# Patient Record
Sex: Female | Born: 1965 | Race: White | Hispanic: No | Marital: Married | Smoking: Never smoker
Health system: Southern US, Community
[De-identification: ages and names within clinical notes are randomized; demographics above are authoritative.]

## PROBLEM LIST (undated history)

## (undated) DIAGNOSIS — D239 Other benign neoplasm of skin, unspecified: Secondary | ICD-10-CM

---
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
## 1998-06-25 ENCOUNTER — Other Ambulatory Visit: Admission: RE | Admit: 1998-06-25 | Discharge: 1998-06-25 | Payer: Self-pay | Admitting: Obstetrics and Gynecology

## 1999-07-12 ENCOUNTER — Other Ambulatory Visit: Admission: RE | Admit: 1999-07-12 | Discharge: 1999-07-12 | Payer: Self-pay | Admitting: Obstetrics and Gynecology

## 2000-07-18 ENCOUNTER — Other Ambulatory Visit: Admission: RE | Admit: 2000-07-18 | Discharge: 2000-07-18 | Payer: Self-pay | Admitting: Obstetrics and Gynecology

## 2000-07-24 ENCOUNTER — Encounter: Payer: Self-pay | Admitting: Obstetrics and Gynecology

## 2000-07-24 ENCOUNTER — Encounter: Admission: RE | Admit: 2000-07-24 | Discharge: 2000-07-24 | Payer: Self-pay | Admitting: Obstetrics and Gynecology

## 2001-07-30 ENCOUNTER — Other Ambulatory Visit: Admission: RE | Admit: 2001-07-30 | Discharge: 2001-07-30 | Payer: Self-pay | Admitting: Obstetrics and Gynecology

## 2002-11-05 ENCOUNTER — Other Ambulatory Visit: Admission: RE | Admit: 2002-11-05 | Discharge: 2002-11-05 | Payer: Self-pay | Admitting: Obstetrics and Gynecology

## 2004-05-04 ENCOUNTER — Ambulatory Visit: Payer: Self-pay

## 2009-10-01 ENCOUNTER — Encounter: Admission: RE | Admit: 2009-10-01 | Discharge: 2009-10-01 | Payer: Self-pay | Admitting: Obstetrics and Gynecology

## 2010-03-15 ENCOUNTER — Emergency Department: Payer: Self-pay | Admitting: Emergency Medicine

## 2010-09-22 ENCOUNTER — Other Ambulatory Visit: Payer: Self-pay | Admitting: Obstetrics and Gynecology

## 2010-09-22 DIAGNOSIS — Z1231 Encounter for screening mammogram for malignant neoplasm of breast: Secondary | ICD-10-CM

## 2010-10-05 ENCOUNTER — Ambulatory Visit
Admission: RE | Admit: 2010-10-05 | Discharge: 2010-10-05 | Disposition: A | Discharge status: Home / Self Care | Payer: BC Managed Care – PPO | Source: Ambulatory Visit | Attending: Obstetrics and Gynecology | Admitting: Obstetrics and Gynecology

## 2010-10-05 DIAGNOSIS — Z1231 Encounter for screening mammogram for malignant neoplasm of breast: Secondary | ICD-10-CM

## 2010-10-06 ENCOUNTER — Other Ambulatory Visit: Payer: Self-pay | Admitting: Obstetrics and Gynecology

## 2010-10-06 DIAGNOSIS — R928 Other abnormal and inconclusive findings on diagnostic imaging of breast: Secondary | ICD-10-CM

## 2010-10-14 ENCOUNTER — Ambulatory Visit
Admission: RE | Admit: 2010-10-14 | Discharge: 2010-10-14 | Disposition: A | Discharge status: Home / Self Care | Payer: BC Managed Care – PPO | Source: Ambulatory Visit | Attending: Obstetrics and Gynecology | Admitting: Obstetrics and Gynecology

## 2010-10-14 DIAGNOSIS — R928 Other abnormal and inconclusive findings on diagnostic imaging of breast: Secondary | ICD-10-CM

## 2011-09-22 ENCOUNTER — Other Ambulatory Visit: Payer: Self-pay | Admitting: Obstetrics & Gynecology

## 2011-09-22 DIAGNOSIS — Z1231 Encounter for screening mammogram for malignant neoplasm of breast: Secondary | ICD-10-CM

## 2011-10-06 ENCOUNTER — Ambulatory Visit
Admission: RE | Admit: 2011-10-06 | Discharge: 2011-10-06 | Disposition: A | Discharge status: Home / Self Care | Payer: BC Managed Care – PPO | Source: Ambulatory Visit | Attending: Obstetrics & Gynecology | Admitting: Obstetrics & Gynecology

## 2011-10-06 DIAGNOSIS — Z1231 Encounter for screening mammogram for malignant neoplasm of breast: Secondary | ICD-10-CM

## 2012-09-13 ENCOUNTER — Other Ambulatory Visit: Payer: Self-pay

## 2012-09-13 DIAGNOSIS — Z1231 Encounter for screening mammogram for malignant neoplasm of breast: Secondary | ICD-10-CM

## 2012-10-09 ENCOUNTER — Ambulatory Visit
Admission: RE | Admit: 2012-10-09 | Discharge: 2012-10-09 | Disposition: A | Discharge status: Home / Self Care | Payer: BC Managed Care – PPO | Source: Ambulatory Visit

## 2012-10-09 DIAGNOSIS — Z1231 Encounter for screening mammogram for malignant neoplasm of breast: Secondary | ICD-10-CM

## 2013-03-13 ENCOUNTER — Ambulatory Visit: Payer: Self-pay | Admitting: Gastroenterology

## 2013-10-01 ENCOUNTER — Other Ambulatory Visit: Payer: Self-pay

## 2013-10-01 DIAGNOSIS — Z1231 Encounter for screening mammogram for malignant neoplasm of breast: Secondary | ICD-10-CM

## 2013-11-11 ENCOUNTER — Ambulatory Visit
Admission: RE | Admit: 2013-11-11 | Discharge: 2013-11-11 | Disposition: A | Discharge status: Home / Self Care | Payer: BC Managed Care – PPO | Source: Ambulatory Visit

## 2013-11-11 DIAGNOSIS — Z1231 Encounter for screening mammogram for malignant neoplasm of breast: Secondary | ICD-10-CM

## 2014-07-28 ENCOUNTER — Ambulatory Visit
Admit: 2014-07-28 | Disposition: A | Discharge status: Home / Self Care | Payer: Self-pay | Attending: Gastroenterology | Admitting: Gastroenterology

## 2014-12-22 ENCOUNTER — Encounter: Payer: Self-pay | Admitting: Podiatry

## 2014-12-22 ENCOUNTER — Ambulatory Visit (INDEPENDENT_AMBULATORY_CARE_PROVIDER_SITE_OTHER): Payer: BC Managed Care – PPO | Admitting: Podiatry

## 2014-12-22 ENCOUNTER — Ambulatory Visit (INDEPENDENT_AMBULATORY_CARE_PROVIDER_SITE_OTHER): Payer: BC Managed Care – PPO

## 2014-12-22 VITALS — BP 157/96 | HR 75 | Resp 16

## 2014-12-22 DIAGNOSIS — M2010 Hallux valgus (acquired), unspecified foot: Secondary | ICD-10-CM

## 2014-12-22 NOTE — Patient Instructions (Signed)

## 2014-12-22 NOTE — Progress Notes (Signed)
   Subjective:    Patient ID: Heidi Watson, female    DOB: December 04, 1965, 49 y.o.   MRN: [id number]  HPI: She presents today with a 5 year duration of painful bunions bilaterally. She states that the right one seems to be worse than the left one. She states this is been going on for quite some time and it really only bothers her if she wears tight shoes. She notices that the joints and started to become more painful with range of motion and fears that she is developing arthritis. She denies any trauma to the feet. 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    Review of Systems  Musculoskeletal: Positive for arthralgias and gait problem.  All other systems reviewed and are negative.      Objective:   Physical Exam: 49 year old white female in no acute distress vital signs stable alert and oriented 3. Pulses are strongly palpable bilateral. Neurologic sensorium is intact per Semmes-Weinstein monofilament. Deep tendon reflexes are intact bilateral muscle strength +5 over 5 dorsiflexion plantar flexors and inverters and everters all just musculature is intact. Orthopedic evaluation demonstrates all joints distal to the ankle for range of motion without crepitation. She has an increase in the first intermetatarsal angle developing hallux abductovalgus deformity. She has limitation on range of motion with a valgus deformity and rotation of the hallux. Cutaneous evaluation demonstrates supple well-hydrated cutis no erythema edema cellulitis drainage or odor.        Assessment & Plan:  Hallux abductovalgus deformity bilateral.  Plan: We discussed the etiology pathology conservative versus surgical therapies. At this point she would like to consider surgery in late spring or early summer next year. At this point we discussed in great detail today [facility]. She will be leaving for [place] as soon as she has finished with her school work in June and will return mid-June for  surgery. I will follow-up with her in May for a consult. I did recommend that we could perform both of these at the same time.

## 2015-01-29 ENCOUNTER — Other Ambulatory Visit: Payer: Self-pay | Admitting: Orthopedic Surgery

## 2015-01-29 DIAGNOSIS — M1711 Unilateral primary osteoarthritis, right knee: Secondary | ICD-10-CM

## 2015-01-29 DIAGNOSIS — M25861 Other specified joint disorders, right knee: Secondary | ICD-10-CM

## 2015-02-09 ENCOUNTER — Ambulatory Visit
Admission: RE | Admit: 2015-02-09 | Discharge: 2015-02-09 | Disposition: A | Discharge status: Home / Self Care | Payer: BC Managed Care – PPO | Source: Ambulatory Visit | Attending: Orthopedic Surgery | Admitting: Orthopedic Surgery

## 2015-02-09 DIAGNOSIS — M7051 Other bursitis of knee, right knee: Secondary | ICD-10-CM | POA: Diagnosis not present

## 2015-02-09 DIAGNOSIS — M25561 Pain in right knee: Secondary | ICD-10-CM | POA: Diagnosis present

## 2015-02-09 DIAGNOSIS — S83241A Other tear of medial meniscus, current injury, right knee, initial encounter: Secondary | ICD-10-CM | POA: Insufficient documentation

## 2015-02-09 DIAGNOSIS — M659 Synovitis and tenosynovitis, unspecified: Secondary | ICD-10-CM | POA: Diagnosis not present

## 2015-02-09 DIAGNOSIS — M25861 Other specified joint disorders, right knee: Secondary | ICD-10-CM

## 2015-02-09 DIAGNOSIS — M1711 Unilateral primary osteoarthritis, right knee: Secondary | ICD-10-CM

## 2015-02-09 DIAGNOSIS — M25461 Effusion, right knee: Secondary | ICD-10-CM | POA: Insufficient documentation

## 2015-08-10 ENCOUNTER — Encounter: Payer: Self-pay | Admitting: Podiatry

## 2015-08-10 ENCOUNTER — Ambulatory Visit (INDEPENDENT_AMBULATORY_CARE_PROVIDER_SITE_OTHER): Payer: BC Managed Care – PPO | Admitting: Podiatry

## 2015-08-10 VITALS — BP 141/85 | HR 72 | Resp 12

## 2015-08-10 DIAGNOSIS — M2010 Hallux valgus (acquired), unspecified foot: Secondary | ICD-10-CM

## 2015-08-10 NOTE — Progress Notes (Signed)
She presents today for follow-up and a surgical consult. She would like to have her bunions corrected in June. She states they have gotten worse and the pain is much more symptomatic. She ears still painful and she looks forward to having these corrected.  Objective: Vital signs are stable she is alert and oriented 3 have reviewed her past mental history medications allergies surgeries and social history. The only change is that she is starting a new hypertension medication. Pulses are strongly palpable neurologic sensorium is intact no open lesions or wounds. Orthopedic evaluation demonstrates moderate hallux abductovalgus deformity bilateral. Painful in range of motion of the first metatarsophalangeal joint and on palpation of the medial hypertrophic condyle. Radiographs were once again reviewed with patient.  Assessment: Hallux abductovalgus deformity bilateral right greater than left.  Plan: Discussed etiology pathology conservative versus surgical therapies. We consented her today for an [facility] bunion repair with screw fixation first metatarsophalangeal joint bilateral. I answered all the questions regarding this procedure to the best mobility in layman's terms. We discussed possible postop complications which may include but are not limited to postop pain bleeding swelling infection recurrence need for further surgery over correction under correction also sensation loss of digit loss of limb loss of life. She signed all through patient the consent form and she was given the appropriate paperwork for the surgery center. We will follow up with her in the near future for surgical intervention. We dispensed to [facility] today for her surgical recovery.

## 2015-08-10 NOTE — Patient Instructions (Signed)

## 2015-08-12 ENCOUNTER — Telehealth: Payer: Self-pay | Admitting: Podiatry

## 2015-08-12 ENCOUNTER — Telehealth: Payer: Self-pay | Admitting: *Deleted

## 2015-08-12 NOTE — Telephone Encounter (Signed)
Ok

## 2015-08-12 NOTE — Telephone Encounter (Signed)
I attempted to call patient in regards to scheduling surgery.  I apologized for not calling her.  I did not receive a message from her regarding scheduling surgery.  I don't know where the missed communication may have gone.  Please accept my apology.  Give me a call back.  "You just left me a message.  I didn't try to call you.  I left a message for someone by the name of Janett Billow.  Then I tried again to speak to someone today and no one ever called me back.  I just decided not to have the surgery."  I'm so sorry.  Janett Billow is our nurse.  She was short staffed yesterday and she's in our [facility] office today so she may not have had the chance to check her messages.  I apologize.  I'll let Dr. Milinda Pointer know.  You take care.  Call if you need [place].

## 2015-08-12 NOTE — Telephone Encounter (Signed)
Patient just called and said that she had called [place] yesterday and left a messages or you to call her back and she never got a call back yesterday. She called today here in [place] and wanted to speak with Laqueta Carina was in a room with a patient so I took a message to return her call. About 15 minutes after that, the patient stormed into our office, threw the air walker boots and the surgery packet back on the front desk and said that she was not going to have the surgery, nor did she need a call back and she was returning our items so she will not be billed for them and walked out.  Dr. Milinda Pointer said that he wanted you to call her back today asap.

## 2015-08-26 ENCOUNTER — Other Ambulatory Visit: Payer: Self-pay | Admitting: Student

## 2015-08-26 DIAGNOSIS — K7689 Other specified diseases of liver: Secondary | ICD-10-CM

## 2015-09-01 ENCOUNTER — Ambulatory Visit
Admission: RE | Admit: 2015-09-01 | Discharge: 2015-09-01 | Disposition: A | Discharge status: Home / Self Care | Payer: BC Managed Care – PPO | Source: Ambulatory Visit | Attending: Student | Admitting: Student

## 2015-09-01 DIAGNOSIS — K7689 Other specified diseases of liver: Secondary | ICD-10-CM | POA: Diagnosis present

## 2015-10-16 DIAGNOSIS — I10 Essential (primary) hypertension: Secondary | ICD-10-CM | POA: Insufficient documentation

## 2016-05-24 DIAGNOSIS — M25531 Pain in right wrist: Secondary | ICD-10-CM | POA: Insufficient documentation

## 2016-05-24 DIAGNOSIS — S52501A Unspecified fracture of the lower end of right radius, initial encounter for closed fracture: Secondary | ICD-10-CM | POA: Insufficient documentation

## 2016-08-03 DIAGNOSIS — S52591D Other fractures of lower end of right radius, subsequent encounter for closed fracture with routine healing: Secondary | ICD-10-CM | POA: Insufficient documentation

## 2016-09-08 ENCOUNTER — Other Ambulatory Visit: Payer: Self-pay | Admitting: Student

## 2016-09-08 DIAGNOSIS — K7689 Other specified diseases of liver: Secondary | ICD-10-CM

## 2016-10-04 ENCOUNTER — Ambulatory Visit
Admission: RE | Admit: 2016-10-04 | Discharge: 2016-10-04 | Disposition: A | Discharge status: Home / Self Care | Payer: BC Managed Care – PPO | Source: Ambulatory Visit | Attending: Student | Admitting: Student

## 2016-10-04 DIAGNOSIS — K7689 Other specified diseases of liver: Secondary | ICD-10-CM | POA: Diagnosis present

## 2016-10-10 DIAGNOSIS — D239 Other benign neoplasm of skin, unspecified: Secondary | ICD-10-CM

## 2016-10-10 HISTORY — DX: Other benign neoplasm of skin, unspecified: D23.9

## 2016-10-11 DIAGNOSIS — C439 Malignant melanoma of skin, unspecified: Secondary | ICD-10-CM

## 2016-10-11 HISTORY — DX: Malignant melanoma of skin, unspecified: C43.9

## 2017-01-18 DIAGNOSIS — M25561 Pain in right knee: Secondary | ICD-10-CM | POA: Insufficient documentation

## 2017-01-18 DIAGNOSIS — M25562 Pain in left knee: Secondary | ICD-10-CM | POA: Insufficient documentation

## 2017-04-26 ENCOUNTER — Other Ambulatory Visit: Payer: Self-pay | Admitting: Podiatry

## 2017-04-26 ENCOUNTER — Ambulatory Visit (INDEPENDENT_AMBULATORY_CARE_PROVIDER_SITE_OTHER): Payer: BC Managed Care – PPO | Admitting: Podiatry

## 2017-04-26 ENCOUNTER — Ambulatory Visit (INDEPENDENT_AMBULATORY_CARE_PROVIDER_SITE_OTHER): Payer: BC Managed Care – PPO

## 2017-04-26 ENCOUNTER — Encounter: Payer: Self-pay | Admitting: Podiatry

## 2017-04-26 DIAGNOSIS — M2012 Hallux valgus (acquired), left foot: Secondary | ICD-10-CM

## 2017-04-26 DIAGNOSIS — M2011 Hallux valgus (acquired), right foot: Secondary | ICD-10-CM

## 2017-04-26 DIAGNOSIS — M79673 Pain in unspecified foot: Secondary | ICD-10-CM

## 2017-04-26 NOTE — Patient Instructions (Signed)
Pre-Operative Instructions  Congratulations, you have decided to take an important step towards improving your quality of life.  You can be assured that the doctors and staff at [facility] will be with you every step of the way.  Here are some important things you should know:  1. Plan to be at the surgery center/hospital at least 1 (one) hour prior to your scheduled time, unless otherwise directed by the surgical center/hospital staff.  You must have a responsible adult accompany you, remain during the surgery and drive you home.  Make sure you have directions to the surgical center/hospital to ensure you arrive on time. 2. If you are having surgery at [facility] or [facility], you will need a copy of your medical history and physical form from your family physician within one month prior to the date of surgery. We will give you a form for your primary physician to complete.  3. We make every effort to accommodate the date you request for surgery.  However, there are times where surgery dates or times have to be moved.  We will contact you as soon as possible if a change in schedule is required.   4. No aspirin/ibuprofen for one week before surgery.  If you are on aspirin, any non-steroidal anti-inflammatory medications (Mobic, Aleve, Ibuprofen) should not be taken seven (7) days prior to your surgery.  You make take Tylenol for pain prior to surgery.  5. Medications - If you are taking daily heart and blood pressure medications, seizure, reflux, allergy, asthma, anxiety, pain or diabetes medications, make sure you notify the surgery center/hospital before the day of surgery so they can tell you which medications you should take or avoid the day of surgery. 6. No food or drink after midnight the night before surgery unless directed otherwise by surgical center/hospital staff. 7. No alcoholic beverages 24-hours prior to surgery.  No smoking 24-hours prior or 24-hours after  surgery. 8. Wear loose pants or shorts. They should be loose enough to fit over bandages, boots, and casts. 9. Don't wear slip-on shoes. Sneakers are preferred. 10. Bring your boot with you to the surgery center/hospital.  Also bring crutches or a walker if your physician has prescribed it for you.  If you do not have this equipment, it will be provided for you after surgery. 11. If you have not been contacted by the surgery center/hospital by the day before your surgery, call to confirm the date and time of your surgery. 12. Leave-time from work may vary depending on the type of surgery you have.  Appropriate arrangements should be made prior to surgery with your employer. 13. Prescriptions will be provided immediately following surgery by your doctor.  Fill these as soon as possible after surgery and take the medication as directed. Pain medications will not be refilled on weekends and must be approved by the doctor. 14. Remove nail polish on the operative foot and avoid getting pedicures prior to surgery. 15. Wash the night before surgery.  The night before surgery wash the foot and leg well with water and the antibacterial soap provided. Be sure to pay special attention to beneath the toenails and in between the toes.  Wash for at least three (3) minutes. Rinse thoroughly with water and dry well with a towel.  Perform this wash unless told not to do so by your physician.  Enclosed: 1 Ice pack (please put in freezer the night before surgery)   1 Hibiclens skin cleaner     Pre-op instructions  If you have any questions regarding the instructions, please do not hesitate to call our office.  [place]: [address], [place], [place] [address] -- [phone]  [place]: [address] [place], [place] [address] -- [phone]  [place]: [address]  [place], [place] [address] -- [phone]  [place]: [address], [place], [place] [address] -- [phone]  Website:  [email or web address] 

## 2017-04-26 NOTE — Progress Notes (Signed)
She presents today stating that her bunion deformities have finally progressed to the point where she feels that she needs to have them corrected.  She states that is starting to affect her ability to perform her daily activities it is interfering with her daily life.  She states that shoe gear is particularly bothersome.  She states that they occur every single day in the right and seems to be worse than the left one.  Objective: Vital signs are stable alert and oriented x3.  Pulses are palpable.  I have reviewed her past medical history medications allergies surgery social history and review of systems.  There are no changes.  She has moderate to severe hallux valgus deformity and radiographs confirm that once again today.  An increase in the first intermetatarsal angle greater than normal value with a hallux abductus angle greater than normal value indicating early dislocation of the first metatarsophalangeal joint.  Hypertrophic medial condyle is also noted first metatarsal.  Assessment: Hallux abductovalgus deformities bilateral right greater than left.  Plan: We discussed the etiology pathology conservative versus surgical therapies today.  At this point we consented her for an [facility] bunion repair with screw fixation to the right foot.  Answered all the questions regarding this procedure to the best my ability in layman's terms.  He understood this was amenable to it and signed all 3 pages of the consent form.  We did discuss the possible postop complications which may consist include but are not limited to postop pain bleeding swelling infection recurrence need further surgery overcorrection under correction and possible removal of internal fixation.  We dispensed a Cam walker today for postop recovery she was also given both oral and written home-going instructions to assist her in paperwork for the transfer especially surgical center and anesthesia group.  Follow-up with her in July for surgery.  She  is to notify us with any health changes between now and that time.

## 2017-09-26 DIAGNOSIS — D039 Melanoma in situ, unspecified: Secondary | ICD-10-CM

## 2017-09-26 HISTORY — DX: Melanoma in situ, unspecified: D03.9

## 2017-10-02 ENCOUNTER — Other Ambulatory Visit: Payer: Self-pay | Admitting: Student

## 2017-10-02 DIAGNOSIS — K7689 Other specified diseases of liver: Secondary | ICD-10-CM

## 2017-11-01 ENCOUNTER — Ambulatory Visit
Admission: RE | Admit: 2017-11-01 | Discharge: 2017-11-01 | Disposition: A | Discharge status: Home / Self Care | Payer: BC Managed Care – PPO | Source: Ambulatory Visit | Attending: Student | Admitting: Student

## 2017-11-01 ENCOUNTER — Other Ambulatory Visit: Payer: Self-pay | Admitting: Podiatry

## 2017-11-01 DIAGNOSIS — K7689 Other specified diseases of liver: Secondary | ICD-10-CM | POA: Insufficient documentation

## 2017-11-01 MED ORDER — OXYCODONE-ACETAMINOPHEN 10-325 MG PO TABS: 1.0000 | ORAL_TABLET | Freq: Three times a day (TID) | ORAL | 0 refills | Status: AC | PRN

## 2017-11-01 MED ORDER — CLINDAMYCIN HCL 150 MG PO CAPS: 150.0000 mg | ORAL_CAPSULE | Freq: Three times a day (TID) | ORAL | 0 refills | Status: DC

## 2017-11-01 MED ORDER — ONDANSETRON HCL 4 MG PO TABS: 4.0000 mg | ORAL_TABLET | Freq: Three times a day (TID) | ORAL | 0 refills | Status: DC | PRN

## 2017-11-03 ENCOUNTER — Encounter: Payer: Self-pay | Admitting: Podiatry

## 2017-11-03 DIAGNOSIS — M2011 Hallux valgus (acquired), right foot: Secondary | ICD-10-CM

## 2017-11-06 ENCOUNTER — Telehealth: Payer: Self-pay | Admitting: *Deleted

## 2017-11-06 NOTE — Telephone Encounter (Signed)
POST OP CALL-    1) General condition stated by the patient: GREAT  2) Is the pt having pain? SOME, NUMB TIL SUNDAY  3) Pain score:   4) Has the pt taken Rx'd pain medication, regularly or PRN? PRN, MOSTLY IBUPROFEN  5) Is the pain medication giving relief? YES  6) Any fever, chills, nausea, or vomiting, shortness of breath or tightness in calf? NAUSEA WHEN INITIALLY TOOK PAIN MEDS  7) Is the bandage clean, dry and intact? YES  8) Is there excessive tightness, bleeding or drainage coming through the bandage? NO  9) Did you understand all of the post op instruction sheet given? NO  10) Any questions or concerns regarding post op care/recovery? ASKING HOW LONG TO BE UP ON IT (ADVISED NO MORE THAN 5-10 MINUTES/HOUR) AND QUESTIONS REGARDING PRESSING TOE DOWN WHEN SHE WAS UP ON IT (ADVISED TO TRY TO WALK AS NORMAL AS POSSIBLE WITH BOOT ON AND TO TRY NOT TO LIFT BIG TOE WHEN WALKING)    Confirmed POV appointment with patient

## 2017-11-08 ENCOUNTER — Ambulatory Visit (INDEPENDENT_AMBULATORY_CARE_PROVIDER_SITE_OTHER): Payer: BC Managed Care – PPO | Admitting: Podiatry

## 2017-11-08 ENCOUNTER — Encounter: Payer: Self-pay | Admitting: Podiatry

## 2017-11-08 ENCOUNTER — Ambulatory Visit (INDEPENDENT_AMBULATORY_CARE_PROVIDER_SITE_OTHER): Payer: BC Managed Care – PPO

## 2017-11-08 DIAGNOSIS — M2011 Hallux valgus (acquired), right foot: Secondary | ICD-10-CM | POA: Diagnosis not present

## 2017-11-08 DIAGNOSIS — Z9889 Other specified postprocedural states: Secondary | ICD-10-CM

## 2017-11-08 NOTE — Progress Notes (Signed)
She presents today for her first postop visit date of surgery November 03, 2017 [facility] bunionectomy right.  She states that I am just doing great.  She denies fever chills nausea vomiting muscle aches and pains.  Objective: Vital signs are stable she is alert and oriented x3 dry sterile dressing intact was removed demonstrates no erythema minimal edema no cellulitis drainage or odor.  Toe is rectus has great motion first metatarsophalangeal joint.  Considerable ecchymosis to the lesser toes radiographs taken today demonstrate an osteotomy with internal fixation appears to be in good position hallux slightly overcorrected however this will more than likely correct itself.  Assessment: Well-healing surgical foot.  Plan: Redressed today dressed a compressive dressing encouraged range of motion exercises and I will follow-up with her in 1 week.

## 2017-11-15 ENCOUNTER — Ambulatory Visit (INDEPENDENT_AMBULATORY_CARE_PROVIDER_SITE_OTHER): Payer: BC Managed Care – PPO | Admitting: Podiatry

## 2017-11-15 ENCOUNTER — Encounter: Payer: Self-pay | Admitting: Podiatry

## 2017-11-15 DIAGNOSIS — M2011 Hallux valgus (acquired), right foot: Secondary | ICD-10-CM

## 2017-11-15 DIAGNOSIS — Z9889 Other specified postprocedural states: Secondary | ICD-10-CM

## 2017-11-15 NOTE — Progress Notes (Signed)
She presents today status post [facility] bunionectomy right foot date of surgery 11/03/2017 he states that is feeling fine no problems whatsoever.  Objective: Vital signs are stable she is alert and oriented x3 has great range of motion of the first metatarsophalangeal joint of the right foot and the toe is rectus.  There is some mild valgus rotation of the toe but minimally so.  Sutures were removed today margins remain well coapted.  She has good range of motion of the first metatarsophalangeal joint left.  Assessment: Well-healing surgical toe left.  Plan: Placed her in a compression anklet today and a Darco shoe on follow-up with her in a couple of weeks at which time we will transition to a regular tennis shoe.

## 2017-11-29 ENCOUNTER — Ambulatory Visit (INDEPENDENT_AMBULATORY_CARE_PROVIDER_SITE_OTHER): Payer: BC Managed Care – PPO

## 2017-11-29 ENCOUNTER — Ambulatory Visit (INDEPENDENT_AMBULATORY_CARE_PROVIDER_SITE_OTHER): Payer: BC Managed Care – PPO | Admitting: Podiatry

## 2017-11-29 ENCOUNTER — Encounter: Payer: Self-pay | Admitting: Podiatry

## 2017-11-29 DIAGNOSIS — Z9889 Other specified postprocedural states: Secondary | ICD-10-CM

## 2017-11-29 DIAGNOSIS — M2011 Hallux valgus (acquired), right foot: Secondary | ICD-10-CM

## 2017-11-29 NOTE — Progress Notes (Signed)
Sherell presents today for postop visit date of surgery November 03, 2017 status post [facility] [place] states that he still feels thick and sticking swells but seems to be doing pretty good.  Objective: Vital signs are stable alert and oriented x3 great range of motion of the first metatarsal phalangeal joint.  Radiograph demonstrates osseously mature individual with healing surgical site.  Assessment: Well-healing surgical site right.  Plan: Allow her to get back into her regular shoe gear and follow-up with me in a couple of months for final set of x-rays.

## 2018-01-10 ENCOUNTER — Ambulatory Visit (INDEPENDENT_AMBULATORY_CARE_PROVIDER_SITE_OTHER): Payer: BC Managed Care – PPO | Admitting: Podiatry

## 2018-01-10 ENCOUNTER — Encounter: Payer: Self-pay | Admitting: Podiatry

## 2018-01-10 ENCOUNTER — Ambulatory Visit (INDEPENDENT_AMBULATORY_CARE_PROVIDER_SITE_OTHER): Payer: BC Managed Care – PPO

## 2018-01-10 DIAGNOSIS — M2011 Hallux valgus (acquired), right foot: Secondary | ICD-10-CM

## 2018-01-10 DIAGNOSIS — Z9889 Other specified postprocedural states: Secondary | ICD-10-CM

## 2018-01-10 NOTE — Progress Notes (Signed)
She presents today date of surgery 11/03/2017 [facility] bunionectomy right states that is doing good not much pain no real concerns.  Objective: Vital signs are stable she is alert and oriented x3 has great range of motion of the first metatarsophalangeal joint.  Assessment: Well-healing surgical foot.  Radiographs confirm this.  Plan: Follow-up with me on as-needed basis.

## 2018-04-09 ENCOUNTER — Other Ambulatory Visit: Payer: Self-pay | Admitting: Family Medicine

## 2018-04-09 DIAGNOSIS — R2231 Localized swelling, mass and lump, right upper limb: Secondary | ICD-10-CM

## 2018-04-13 ENCOUNTER — Ambulatory Visit
Admission: RE | Admit: 2018-04-13 | Discharge: 2018-04-13 | Disposition: A | Discharge status: Home / Self Care | Payer: BC Managed Care – PPO | Source: Ambulatory Visit | Attending: Family Medicine | Admitting: Family Medicine

## 2018-04-13 DIAGNOSIS — R2231 Localized swelling, mass and lump, right upper limb: Secondary | ICD-10-CM | POA: Insufficient documentation

## 2018-04-18 ENCOUNTER — Other Ambulatory Visit: Payer: BC Managed Care – PPO

## 2018-06-13 ENCOUNTER — Ambulatory Visit: Payer: BC Managed Care – PPO | Admitting: Podiatry

## 2018-06-20 ENCOUNTER — Ambulatory Visit (INDEPENDENT_AMBULATORY_CARE_PROVIDER_SITE_OTHER): Payer: BC Managed Care – PPO | Admitting: Podiatry

## 2018-06-20 ENCOUNTER — Encounter: Payer: Self-pay | Admitting: Podiatry

## 2018-06-20 ENCOUNTER — Other Ambulatory Visit: Payer: Self-pay

## 2018-06-20 DIAGNOSIS — M2012 Hallux valgus (acquired), left foot: Secondary | ICD-10-CM

## 2018-06-20 NOTE — Patient Instructions (Signed)
Pre-Operative Instructions  Congratulations, you have decided to take an important step towards improving your quality of life.  You can be assured that the doctors and staff at [facility] will be with you every step of the way.  Here are some important things you should know:  1. Plan to be at the surgery center/hospital at least 1 (one) hour prior to your scheduled time, unless otherwise directed by the surgical center/hospital staff.  You must have a responsible adult accompany you, remain during the surgery and drive you home.  Make sure you have directions to the surgical center/hospital to ensure you arrive on time. 2. If you are having surgery at [facility], you will need a copy of your medical history and physical form from your family physician within one month prior to the date of surgery. We will give you a form for your primary physician to complete.  3. We make every effort to accommodate the date you request for surgery.  However, there are times where surgery dates or times have to be moved.  We will contact you as soon as possible if a change in schedule is required.   4. No aspirin/ibuprofen for one week before surgery.  If you are on aspirin, any non-steroidal anti-inflammatory medications (Mobic, Aleve, Ibuprofen) should not be taken seven (7) days prior to your surgery.  You make take Tylenol for pain prior to surgery.  5. Medications - If you are taking daily heart and blood pressure medications, seizure, reflux, allergy, asthma, anxiety, pain or diabetes medications, make sure you notify the surgery center/hospital before the day of surgery so they can tell you which medications you should take or avoid the day of surgery. 6. No food or drink after midnight the night before surgery unless directed otherwise by surgical center/hospital staff. 7. No alcoholic beverages 24-hours prior to surgery.  No smoking 24-hours prior or 24-hours after  surgery. 8. Wear loose pants or shorts. They should be loose enough to fit over bandages, boots, and casts. 9. Don't wear slip-on shoes. Sneakers are preferred. 10. Bring your boot with you to the surgery center/hospital.  Also bring crutches or a walker if your physician has prescribed it for you.  If you do not have this equipment, it will be provided for you after surgery. 11. If you have not been contacted by the surgery center/hospital by the day before your surgery, call to confirm the date and time of your surgery. 12. Leave-time from work may vary depending on the type of surgery you have.  Appropriate arrangements should be made prior to surgery with your employer. 13. Prescriptions will be provided immediately following surgery by your doctor.  Fill these as soon as possible after surgery and take the medication as directed. Pain medications will not be refilled on weekends and must be approved by the doctor. 14. Remove nail polish on the operative foot and avoid getting pedicures prior to surgery. 15. Wash the night before surgery.  The night before surgery wash the foot and leg well with water and the antibacterial soap provided. Be sure to pay special attention to beneath the toenails and in between the toes.  Wash for at least three (3) minutes. Rinse thoroughly with water and dry well with a towel.  Perform this wash unless told not to do so by your physician.  Enclosed: 1 Ice pack (please put in freezer the night before surgery)   1 Hibiclens skin cleaner     Pre-op instructions  If you have any questions regarding the instructions, please do not hesitate to call our office.  [place]: [address], , [place] [address] -- [phone]  [place]: [address] [place], [place] [address] -- [phone]  [place]: [address]  [place], [place] [address] -- [phone]  [place]: [address], [place], [place] [address] -- [phone]  Website:  [email or web address] 

## 2018-06-20 NOTE — Progress Notes (Signed)
She presents today for surgical consult regarding her left foot.  She had surgery done to the right foot and like to go ahead and have the left one done is starting to affect her ability to perform her daily activities.  Objective: Vital signs are stable she is alert and oriented x3.  There is erythema and edema along the medial aspect of the first metatarsal phalangeal joint and painful on palpation.  Pulses are strongly palpable capillary fill time is immediate.  Neurologic extremities intact degenerative flexors are intact muscle strength is normal symmetrical.  Orthopedic evaluation trace all joints distal ankle full range of motion without crepitation.  Moderate severe hallux valgus deformity of the left foot with hallux abductus.  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  Assessment: Painful bunion deformity of the left foot.  Plan: We consented her today for an [facility] bunion repair with screw fixation dislike on her other foot she tolerated that surgery well.  At this point we went over consent form today line by line number by number giving her ample time to ask questions she saw fit regarding his procedures answering best my ability extremity understood was amenable to it signed out the page of the consent form.  Dispensed a Cam walker today as well as information regarding the procedure.  We provided her with information regarding the surgery center and anesthesia group.  She understands possible postop complications which may include but not limited to postop pain bleeding swelling infection recurrence need further surgery overcorrection under correction also digit loss of limb loss of life.

## 2018-10-09 ENCOUNTER — Other Ambulatory Visit: Payer: Self-pay | Admitting: Student

## 2018-10-09 DIAGNOSIS — K7689 Other specified diseases of liver: Secondary | ICD-10-CM

## 2018-10-09 DIAGNOSIS — R748 Abnormal levels of other serum enzymes: Secondary | ICD-10-CM

## 2018-10-26 ENCOUNTER — Telehealth: Payer: Self-pay | Admitting: *Deleted

## 2018-10-26 NOTE — Telephone Encounter (Signed)
DOS 11/02/2018; 17510 - [place] BUNIONECTOMY LT FOOT  BCBS: Effective Date - 04/11/2018 - 04/10/9998    In-Network    Max Per Benefit Period Year-to-Date Remaining  CoInsurance  30%   Deductible  $1500.00 $1235.35  Out-Of-Pocket  $5900.00 $5027.05     AMBULATORY SURGERY  In Network  [employer]ay Coinsurance  Not Applicable  25% per SERVICE YEAR    -------------------------------------------------------------------------------------------------------------------------  BCBS 2ND - [id number]: Effective Date - 07/13/2014 - 04/10/9998   In-Network Individual  Copay Coinsurance Authorization Required  Not Applicable  0%  No

## 2018-10-29 ENCOUNTER — Ambulatory Visit
Admission: RE | Admit: 2018-10-29 | Discharge: 2018-10-29 | Disposition: A | Discharge status: Home / Self Care | Payer: BC Managed Care – PPO | Source: Ambulatory Visit | Attending: Student | Admitting: Student

## 2018-10-29 ENCOUNTER — Other Ambulatory Visit: Payer: Self-pay

## 2018-10-29 DIAGNOSIS — R748 Abnormal levels of other serum enzymes: Secondary | ICD-10-CM | POA: Insufficient documentation

## 2018-10-29 DIAGNOSIS — K7689 Other specified diseases of liver: Secondary | ICD-10-CM | POA: Diagnosis present

## 2018-10-29 MED ORDER — GADOBUTROL 1 MMOL/ML IV SOLN
7.5000 mL | Freq: Once | INTRAVENOUS | Status: AC | PRN
  Administered 2018-10-29: 7 mL via INTRAVENOUS

## 2018-11-01 ENCOUNTER — Other Ambulatory Visit: Payer: Self-pay | Admitting: Podiatry

## 2018-11-01 MED ORDER — OXYCODONE-ACETAMINOPHEN 10-325 MG PO TABS: 1.0000 | ORAL_TABLET | Freq: Four times a day (QID) | ORAL | 0 refills | Status: AC | PRN

## 2018-11-01 MED ORDER — CLINDAMYCIN HCL 150 MG PO CAPS: 150.0000 mg | ORAL_CAPSULE | Freq: Three times a day (TID) | ORAL | 0 refills | Status: DC

## 2018-11-01 MED ORDER — ONDANSETRON HCL 4 MG PO TABS: 4.0000 mg | ORAL_TABLET | Freq: Three times a day (TID) | ORAL | 0 refills | Status: DC | PRN

## 2018-11-02 DIAGNOSIS — M2012 Hallux valgus (acquired), left foot: Secondary | ICD-10-CM | POA: Diagnosis not present

## 2018-11-07 ENCOUNTER — Other Ambulatory Visit: Payer: Self-pay

## 2018-11-07 ENCOUNTER — Ambulatory Visit (INDEPENDENT_AMBULATORY_CARE_PROVIDER_SITE_OTHER): Payer: BC Managed Care – PPO | Admitting: Podiatry

## 2018-11-07 ENCOUNTER — Ambulatory Visit (INDEPENDENT_AMBULATORY_CARE_PROVIDER_SITE_OTHER): Payer: BC Managed Care – PPO

## 2018-11-07 VITALS — Temp 98.4°F

## 2018-11-07 DIAGNOSIS — M2012 Hallux valgus (acquired), left foot: Secondary | ICD-10-CM | POA: Diagnosis not present

## 2018-11-07 DIAGNOSIS — Z9889 Other specified postprocedural states: Secondary | ICD-10-CM

## 2018-11-07 NOTE — Progress Notes (Signed)
She presents today for her first postop visit date of surgery November 02, 2018 status post [facility] bunion repair left foot.  She states that is doing really great I have not had any pain whatsoever.  She denies fever chills nausea vomiting muscle aches pains calf pain back pain chest pain shortness of breath.  Objective: Dressed her dressing intact ambulating with a cam walker.  Was removed demonstrates no erythema mild edema no cellulitis drainage or odor she has great range of motion of the first metatarsal phalangeal joint incision site appears to be healing very nicely.  Radiographs confirm today first metatarsal osteotomy in good alignment good position with a single 3.0 cannulated screw.  Assessment: Well-healing surgical foot left.  Plan: Redressed today dressed a compressive dressing encourage range of motion activities and exercises which were demonstrated to the patient she is placed back in her cam walker encouraged to keep the foot elevated as much as possible but to perform her exercises.  Follow-up with her in 1 week

## 2018-11-08 ENCOUNTER — Encounter: Payer: Self-pay | Admitting: Podiatry

## 2018-11-14 ENCOUNTER — Ambulatory Visit (INDEPENDENT_AMBULATORY_CARE_PROVIDER_SITE_OTHER): Payer: BC Managed Care – PPO | Admitting: Podiatry

## 2018-11-14 ENCOUNTER — Other Ambulatory Visit: Payer: Self-pay

## 2018-11-14 ENCOUNTER — Encounter: Payer: Self-pay | Admitting: Podiatry

## 2018-11-14 VITALS — Temp 98.4°F

## 2018-11-14 DIAGNOSIS — Z9889 Other specified postprocedural states: Secondary | ICD-10-CM | POA: Diagnosis not present

## 2018-11-14 NOTE — Progress Notes (Signed)
She presents today for second postop visit date of surgery 11/02/2018 status post Heidi Watson bunionectomy left states that is doing better it was painful on Sunday and it feels like the boot rubs where the bunion is.  Objective: Vital signs are stable she is alert oriented x3 dressed her dressing intact was removed demonstrates no erythema just mild edema no cellulitis drainage or odor.  She has good range of motion of the first metatarsal phalangeal joint.  Assessment: Well-healing surgical foot.  Plan: Placed in a compression anklet today and a Darco shoe follow-up with her in 2 weeks

## 2018-11-27 ENCOUNTER — Ambulatory Visit (INDEPENDENT_AMBULATORY_CARE_PROVIDER_SITE_OTHER): Payer: BC Managed Care – PPO | Admitting: Podiatry

## 2018-11-27 ENCOUNTER — Encounter: Payer: Self-pay | Admitting: Podiatry

## 2018-11-27 ENCOUNTER — Ambulatory Visit (INDEPENDENT_AMBULATORY_CARE_PROVIDER_SITE_OTHER): Payer: BC Managed Care – PPO

## 2018-11-27 ENCOUNTER — Other Ambulatory Visit: Payer: Self-pay

## 2018-11-27 VITALS — Temp 98.4°F

## 2018-11-27 DIAGNOSIS — Z9889 Other specified postprocedural states: Secondary | ICD-10-CM

## 2018-11-27 DIAGNOSIS — M2012 Hallux valgus (acquired), left foot: Secondary | ICD-10-CM

## 2018-11-29 NOTE — Progress Notes (Signed)
   Subjective:  Patient presents today status post bunionectomy left. DOS: 11/02/2018. She states she is improving. She reports some soreness of the dorsal foot that is caused by walking. She has been using the post op shoe and compression anklet. Patient is here for further evaluation and treatment.    No past medical history on file.    Objective/Physical Exam Neurovascular status intact.  Skin incisions appear to be well coapted with sutures and staples intact. No sign of infectious process noted. No dehiscence. No active bleeding noted. Moderate edema noted to the surgical extremity.  Radiographic Exam:  Orthopedic hardware and osteotomies sites appear to be stable with routine healing.  Assessment: 1. s/p bunionectomy left. DOS: 11/02/2018   Plan of Care:  1. Patient was evaluated. X-rays reviewed 2. Continue using post op shoe for two weeks.  3. Continue using compression anklet.  4. Return to clinic in 2 weeks for possible transition into sneakers and follow up X-Ray.    Edrick Kins, DPM [facility]  Dr. Edrick Kins, [address]                                        [place], [place] [address]                Office [phone]  Fax [phone]

## 2018-12-12 ENCOUNTER — Ambulatory Visit (INDEPENDENT_AMBULATORY_CARE_PROVIDER_SITE_OTHER): Payer: BC Managed Care – PPO

## 2018-12-12 ENCOUNTER — Other Ambulatory Visit: Payer: Self-pay

## 2018-12-12 ENCOUNTER — Ambulatory Visit (INDEPENDENT_AMBULATORY_CARE_PROVIDER_SITE_OTHER): Payer: Self-pay | Admitting: Podiatry

## 2018-12-12 DIAGNOSIS — M2012 Hallux valgus (acquired), left foot: Secondary | ICD-10-CM

## 2018-12-12 DIAGNOSIS — Z9889 Other specified postprocedural states: Secondary | ICD-10-CM

## 2018-12-12 NOTE — Progress Notes (Signed)
She presents today date of surgery 11/02/2018 status post Liane Comber bunionectomy left states that is doing great still swells some on and off with pain but all in all is doing much better.  Objective: Vital signs are stable she is alert and oriented x3.  Pulses are palpable.  She has no erythema edema cellulitis drainage odor radiographs demonstrate some motion of the head of the osteotomy site which is gone on to resolve.  She has great range of motion of the first metatarsophalangeal joint.  Assessment: Well-healing surgical toe left.  Plan: We will allow her to get back into her regular tennis shoe and follow-up with me in 1 month if necessary.

## 2019-01-09 ENCOUNTER — Encounter: Payer: BC Managed Care – PPO | Admitting: Podiatry

## 2019-06-08 ENCOUNTER — Ambulatory Visit: Payer: BC Managed Care – PPO | Attending: Internal Medicine

## 2019-06-08 DIAGNOSIS — Z23 Encounter for immunization: Secondary | ICD-10-CM | POA: Insufficient documentation

## 2019-06-08 NOTE — Progress Notes (Signed)
   Covid-19 Vaccination Clinic  Name:  Heidi Watson    MRN: [id number] DOB: April 24, 1965  06/08/2019  [name] was observed post Covid-19 immunization for 15 minutes without incidence. She was provided with Vaccine Information Sheet and instruction to access the V-Safe system.   [name] was instructed to call 911 with any severe reactions post vaccine: Marland Kitchen Difficulty breathing  . Swelling of your face and throat  . A fast heartbeat  . A bad rash all over your body  . Dizziness and weakness    Immunizations Administered    Name Date Dose VIS Date Route   Moderna COVID-19 Vaccine 06/08/2019  1:18 PM 0.5 mL 03/12/2019 Intramuscular   Manufacturer: Moderna   Lot: [id number]   [id number]

## 2019-07-06 ENCOUNTER — Ambulatory Visit: Payer: BC Managed Care – PPO

## 2019-07-09 ENCOUNTER — Ambulatory Visit: Payer: BC Managed Care – PPO

## 2019-07-13 ENCOUNTER — Ambulatory Visit: Payer: BC Managed Care – PPO

## 2019-09-23 ENCOUNTER — Other Ambulatory Visit: Payer: Self-pay

## 2019-09-23 ENCOUNTER — Ambulatory Visit (INDEPENDENT_AMBULATORY_CARE_PROVIDER_SITE_OTHER): Payer: BC Managed Care – PPO | Admitting: Dermatology

## 2019-09-23 DIAGNOSIS — Z1283 Encounter for screening for malignant neoplasm of skin: Secondary | ICD-10-CM

## 2019-09-23 DIAGNOSIS — L409 Psoriasis, unspecified: Secondary | ICD-10-CM

## 2019-09-23 DIAGNOSIS — D229 Melanocytic nevi, unspecified: Secondary | ICD-10-CM

## 2019-09-23 DIAGNOSIS — Z85820 Personal history of malignant melanoma of skin: Secondary | ICD-10-CM

## 2019-09-23 DIAGNOSIS — L719 Rosacea, unspecified: Secondary | ICD-10-CM | POA: Diagnosis not present

## 2019-09-23 DIAGNOSIS — Z86018 Personal history of other benign neoplasm: Secondary | ICD-10-CM

## 2019-09-23 DIAGNOSIS — L578 Other skin changes due to chronic exposure to nonionizing radiation: Secondary | ICD-10-CM

## 2019-09-23 DIAGNOSIS — D1801 Hemangioma of skin and subcutaneous tissue: Secondary | ICD-10-CM

## 2019-09-23 DIAGNOSIS — L821 Other seborrheic keratosis: Secondary | ICD-10-CM

## 2019-09-23 DIAGNOSIS — L814 Other melanin hyperpigmentation: Secondary | ICD-10-CM

## 2019-09-23 MED ORDER — TRIAMCINOLONE ACETONIDE 0.1 % EX CREA: 1.0000 "application " | TOPICAL_CREAM | Freq: Two times a day (BID) | CUTANEOUS | 4 refills | Status: DC | PRN

## 2019-09-23 MED ORDER — ENSTILAR 0.005-0.064 % EX FOAM: CUTANEOUS | 4 refills | Status: DC

## 2019-09-23 NOTE — Progress Notes (Signed)
   Follow-Up Visit   Subjective  Heidi Watson is a 54 y.o. female who presents for the following: Annual Exam (4 months f/u TBSE, Hx of Melanoma ). Pt report she has not noticed any new moles of concern.  Hx of Melanoma L distal tricep removed 09-26-2017 Hx of Melanoma R posterior proximal lateral thigh removed 09-20-2016 The patient presents for Total-Body Skin Exam (TBSE) for skin cancer screening and mole check.  The following portions of the chart were reviewed this encounter and updated as appropriate:  Tobacco  Allergies  Meds  Problems  Med Hx  Surg Hx  Fam Hx      Review of Systems:  No other skin or systemic complaints except as noted in HPI or Assessment and Plan.  Objective  Well appearing patient in no apparent distress; mood and affect are within normal limits.  A full examination was performed including scalp, head, eyes, ears, nose, lips, neck, chest, axillae, abdomen, back, buttocks, bilateral upper extremities, bilateral lower extremities, hands, feet, fingers, toes, fingernails, and toenails. All findings within normal limits unless otherwise noted below.  Objective  multiple see history: Scar with no evidence of recurrence.   Objective  Head - Anterior (Face): Mid face erythema with telangiectasias +/- scattered inflammatory papules.   Objective  legs, arms: Well-demarcated erythematous papules/plaques with silvery scale, guttate pink scaly papules.    Assessment & Plan    History of dysplastic nevus multiple see history Clear. Observe for recurrence. Call clinic for new or changing lesions.  Recommend regular skin exams, daily broad-spectrum spf 30+ sunscreen use, and photoprotection.    Observe   Rosacea Head - Anterior (Face)  Mild Rosacea  No treatment needed   Psoriasis legs, arms  Ordered Medications: triamcinolone cream (KENALOG) 0.1 %  Other Related Medications Calcipotriene-Betameth Diprop (ENSTILAR) 0.005-0.064 %  FOAM  Lentigines - Scattered tan macules - Discussed due to sun exposure - Benign, observe - Call for any changes  Seborrheic Keratoses - Stuck-on, waxy, tan-brown papules and plaques  - Discussed benign etiology and prognosis. - Observe - Call for any changes  Melanocytic Nevi - Tan-brown and/or pink-flesh-colored symmetric macules and papules - Benign appearing on exam today - Observation - Call clinic for new or changing moles - Recommend daily use of broad spectrum spf 30+ sunscreen to sun-exposed areas.   Hemangiomas - Red papules - Discussed benign nature - Observe - Call for any changes  Actinic Damage - diffuse scaly erythematous macules with underlying dyspigmentation - Recommend daily broad spectrum sunscreen SPF 30+ to sun-exposed areas, reapply every 2 hours as needed.  - Call for new or changing lesions.  Skin cancer screening performed today.  History of Melanoma x 2 L distal tricep R posterior proximal lateral thigh  - No evidence of recurrence today - No lymphadenopathy - Recommend regular full body skin exams - Recommend daily broad spectrum sunscreen SPF 30+ to sun-exposed areas, reapply every 2 hours as needed.  - Call if any new or changing lesions are noted between office visits  Return in about 6 months (around 03/24/2020) for TBSE hx of Melanoma.   IMarye Round, CMA, am acting as scribe for Sarina Ser, MD .  Documentation: I have reviewed the above documentation for accuracy and completeness, and I agree with the above.  Sarina Ser, MD

## 2019-09-24 ENCOUNTER — Other Ambulatory Visit: Payer: Self-pay

## 2019-09-24 ENCOUNTER — Telehealth: Payer: Self-pay

## 2019-09-24 DIAGNOSIS — L409 Psoriasis, unspecified: Secondary | ICD-10-CM

## 2019-09-24 MED ORDER — ENSTILAR 0.005-0.064 % EX FOAM: CUTANEOUS | 4 refills | Status: DC

## 2019-09-24 NOTE — Telephone Encounter (Signed)
Fax from [place] states Heidi Watson will be over $800 with patients insurance.

## 2019-09-24 NOTE — Telephone Encounter (Signed)
Consider combination of: Clobetasol cream at bedtime arms and legs for psoriasis on Mon; Wed; Fri as needed. Calcipotriene cream at bedtime arms; legs for psoriasis Tues; Thurs; Sat as needed.

## 2019-09-25 ENCOUNTER — Encounter: Payer: Self-pay | Admitting: Dermatology

## 2019-09-25 NOTE — Telephone Encounter (Signed)
Called and spoke with patient today. She already has Betamethasone and Calcipotriene tubes at home. Can she use these separately without having to purchase the Clobetasol as well?

## 2019-09-25 NOTE — Telephone Encounter (Signed)
Yes, -- use what she has. Betamethasone and Clobetasol are similar (= topical steroids) Calcipotriene is a Vitamin D analog cream that helps with psoriasis.

## 2019-09-26 NOTE — Telephone Encounter (Signed)
Patient advised of information per Dr. Kowalski.  

## 2020-01-06 ENCOUNTER — Other Ambulatory Visit: Payer: Self-pay | Admitting: Podiatry

## 2020-01-06 ENCOUNTER — Encounter: Payer: Self-pay | Admitting: Podiatry

## 2020-01-06 ENCOUNTER — Ambulatory Visit (INDEPENDENT_AMBULATORY_CARE_PROVIDER_SITE_OTHER): Payer: BC Managed Care – PPO | Admitting: Podiatry

## 2020-01-06 ENCOUNTER — Other Ambulatory Visit: Payer: Self-pay

## 2020-01-06 ENCOUNTER — Ambulatory Visit (INDEPENDENT_AMBULATORY_CARE_PROVIDER_SITE_OTHER): Payer: BC Managed Care – PPO

## 2020-01-06 DIAGNOSIS — M7751 Other enthesopathy of right foot: Secondary | ICD-10-CM | POA: Diagnosis not present

## 2020-01-06 DIAGNOSIS — M67471 Ganglion, right ankle and foot: Secondary | ICD-10-CM

## 2020-01-06 NOTE — Progress Notes (Signed)
She presents today after having not seen her for more than a year with a chief complaint of feels like a walking assist or something that keeps rolling underneath my big toe over here she points to the fibular portion.  States it feels like there is a knot on the bottom of the foot started about 3 weeks or so ago after he got a new pair shoes.  I can feel it when I am walking barefoot and is very uncomfortable.  Objective: Vital signs are stable alert oriented x3.  Pulses are palpable.  She has great range of motion at the surgical site first metatarsophalangeal joint of the right foot palpable fibular sesamoid does demonstrate what appears to be a bursa or neuritis associated with that with the lack of pain I would assume there is some bursitis she also has 1 in the medial aspect of the first metatarsophalangeal joint well I think this has more to do with her new shoes than anything else and suggest started after getting those.  Assessment: Bursitis possible neuritis plantar aspect plantar medial aspect of the first metatarsophalangeal joint right foot.  Plan: Offered her an injection today she declined states that she would change her shoes follow-up with me as needed

## 2020-02-11 ENCOUNTER — Other Ambulatory Visit: Payer: Self-pay

## 2020-02-11 ENCOUNTER — Ambulatory Visit (INDEPENDENT_AMBULATORY_CARE_PROVIDER_SITE_OTHER): Payer: BC Managed Care – PPO | Admitting: Dermatology

## 2020-02-11 DIAGNOSIS — L409 Psoriasis, unspecified: Secondary | ICD-10-CM

## 2020-02-11 DIAGNOSIS — D692 Other nonthrombocytopenic purpura: Secondary | ICD-10-CM

## 2020-02-11 MED ORDER — ENSTILAR 0.005-0.064 % EX FOAM: CUTANEOUS | 2 refills | Status: DC

## 2020-02-11 NOTE — Progress Notes (Signed)
   Follow-Up Visit   Subjective  Heidi Watson is a 54 y.o. female who presents for the following: Spot Check (pink spot on outside of right foot x approx 1 month, not itchy, irritating, or painful. ).  She has psoriasis on her hands and feet which is currently flared.  She was using [employer], but insurance wouldn't cover. Pt states that she thought it was psoriasis and tried to treat with betamethasone and clobetasol but did not see any improvement.   The following portions of the chart were reviewed this encounter and updated as appropriate:     Review of Systems: No other skin or systemic complaints except as noted in HPI or Assessment and Plan.   Objective  Well appearing patient in no apparent distress; mood and affect are within normal limits.  A focused examination was performed including feet and hands. Relevant physical exam findings are noted in the Assessment and Plan.  Objective  right lateral heel: 1 cm speckled red/brown macule with mild scale  Objective  right plantar lateral foot, bilateral thumbs: Erythematous patch with hyperkeratosis right greater than left plantar foot, BL thumbs with erythema/scale and fissures  Assessment & Plan  Purpura ([place]) right lateral heel  Likely due to trauma Use Vaseline and cover with bandage for a few weeks until healed. Avoid tight shoes that rub.  Psoriasis right plantar lateral foot, bilateral thumbs  Chronic, flared  Pt has been using generic Betamethasone and Clobetasol with no improvement.   Will try sending in Enstilar qhs to [place]. If not covered will try Duobri lotion qhs.   Discussed possible Xtrac treatments in the future. Will check to see if treatment would be covered by insurance.    Discussed Rutherford Nail- pt declines.   Side effects of Otezla (apremilast) include diarrhea, nausea, headache, upper respiratory infection, depression, and weight decrease (5-10%). It should only be taken by pregnant women  after a discussion regarding risks and benefits with their doctor. Goal is control of skin condition, not cure.  The use of Rutherford Nail requires long term medication management, including periodic office visits.  Briefly discussed Skyrizi as another option- pt declines at this time.    Calcipotriene-Betameth Diprop (ENSTILAR) 0.005-0.064 % FOAM - right plantar lateral foot, bilateral thumbs  Other Related Medications triamcinolone cream (KENALOG) 0.1 % Calcipotriene-Betameth Diprop (ENSTILAR) 0.005-0.064 % FOAM  Return in 5 weeks (on 03/18/2020) for as scheduled for TBSE with Dr. Raliegh Ip, f/up psoriasis, recheck heel.   I, Harriett Sine, CMA, am acting as scribe for Brendolyn Patty, MD.  Documentation: I have reviewed the above documentation for accuracy and completeness, and I agree with the above.  Brendolyn Patty MD

## 2020-02-20 ENCOUNTER — Telehealth: Payer: Self-pay

## 2020-02-20 NOTE — Telephone Encounter (Signed)
Called patient and advised her of xtrac benefits. At this time she would like to hold off on treatments due to working and being able to get to the office twice weekly.

## 2020-03-18 ENCOUNTER — Encounter: Payer: Self-pay | Admitting: Dermatology

## 2020-03-18 ENCOUNTER — Other Ambulatory Visit: Payer: Self-pay

## 2020-03-18 ENCOUNTER — Ambulatory Visit (INDEPENDENT_AMBULATORY_CARE_PROVIDER_SITE_OTHER): Payer: BC Managed Care – PPO | Admitting: Dermatology

## 2020-03-18 DIAGNOSIS — Z1283 Encounter for screening for malignant neoplasm of skin: Secondary | ICD-10-CM | POA: Diagnosis not present

## 2020-03-18 DIAGNOSIS — Z86006 Personal history of melanoma in-situ: Secondary | ICD-10-CM

## 2020-03-18 DIAGNOSIS — L409 Psoriasis, unspecified: Secondary | ICD-10-CM | POA: Diagnosis not present

## 2020-03-18 DIAGNOSIS — Z85820 Personal history of malignant melanoma of skin: Secondary | ICD-10-CM

## 2020-03-18 DIAGNOSIS — L814 Other melanin hyperpigmentation: Secondary | ICD-10-CM

## 2020-03-18 DIAGNOSIS — T1490XA Injury, unspecified, initial encounter: Secondary | ICD-10-CM

## 2020-03-18 DIAGNOSIS — D229 Melanocytic nevi, unspecified: Secondary | ICD-10-CM

## 2020-03-18 DIAGNOSIS — D1800 Hemangioma unspecified site: Secondary | ICD-10-CM

## 2020-03-18 DIAGNOSIS — L821 Other seborrheic keratosis: Secondary | ICD-10-CM

## 2020-03-18 DIAGNOSIS — L820 Inflamed seborrheic keratosis: Secondary | ICD-10-CM

## 2020-03-18 DIAGNOSIS — L578 Other skin changes due to chronic exposure to nonionizing radiation: Secondary | ICD-10-CM

## 2020-03-18 NOTE — Progress Notes (Signed)
Follow-Up Visit   Subjective  Heidi Watson is a 54 y.o. female who presents for the following: mole check (Total body skin exam, hx Melanoma, Melanoma IS, Dysplastic nevi), growth (L collar bone, >90yrs, pt picks at), new areas (R great toenail x 1 wk, pt had trauma ), spots (L palm x 16m, ), brown patch (R nose, 82m), brown patch (R arm, 15m), and Psoriasis (feet, Enstilar foam too expensive). The patient presents for Total-Body Skin Exam (TBSE) for skin cancer screening and mole check.  The following portions of the chart were reviewed this encounter and updated as appropriate:   Tobacco  Allergies  Meds  Problems  Med Hx  Surg Hx  Fam Hx     Review of Systems:  No other skin or systemic complaints except as noted in HPI or Assessment and Plan.  Objective  Well appearing patient in no apparent distress; mood and affect are within normal limits.  A full examination was performed including scalp, head, eyes, ears, nose, lips, neck, chest, axillae, abdomen, back, buttocks, bilateral upper extremities, bilateral lower extremities, hands, feet, fingers, toes, fingernails, and toenails. All findings within normal limits unless otherwise noted below.  Objective  bil feet, R elbow: Well-demarcated erythematous papules/plaques with silvery scale, guttate pink scaly papules.   Objective  Left distal tricep: Well healed scar with no evidence of recurrence, no lymphadenopathy.   Objective  right post prox lat thigh: Well healed scar with no evidence of recurrence, no lymphadenopathy.   Objective  Left clavicle x 1, L knee x 1 (2): Erythematous keratotic or waxy stuck-on papule or plaque.   Objective  R great toenail: Purpura under toenail   Assessment & Plan  Psoriasis bil feet, R elbow Start Enstilar Foam qd  If not covered can send generic Taclonex oint Psoriasis is a chronic non-curable, but treatable genetic/hereditary disease that may have other systemic features affecting  other organ systems such as joints (Psoriatic Arthritis). It is associated with an increased risk of inflammatory bowel disease, heart disease, non-alcoholic fatty liver disease, and depression.    Other Related Medications triamcinolone cream (KENALOG) 0.1 % Calcipotriene-Betameth Diprop (ENSTILAR) 0.005-0.064 % FOAM Calcipotriene-Betameth Diprop (ENSTILAR) 0.005-0.064 % FOAM  History of melanoma in situ Left distal tricep 09/26/2017 Clear.  No Lymphadenopathy.  Observe for recurrence. Call clinic for new or changing lesions.  Recommend regular skin exams, daily broad-spectrum spf 30+ sunscreen use, and photoprotection.     History of melanoma right post prox lat thigh Breslow's 0.25mm, Clark Level II 10/2016 Clear. No Lymphadenopathy. Observe for recurrence. Call clinic for new or changing lesions.  Recommend regular skin exams, daily broad-spectrum spf 30+ sunscreen use, and photoprotection.     Inflamed seborrheic keratosis (2) Left clavicle x 1, L knee x 1  Destruction of lesion - Left clavicle x 1, L knee x 1 Complexity: simple   Destruction method: cryotherapy   Informed consent: discussed and consent obtained   Timeout:  patient name, date of birth, surgical site, and procedure verified Lesion destroyed using liquid nitrogen: Yes   Region frozen until ice ball extended beyond lesion: Yes   Outcome: patient tolerated procedure well with no complications   Post-procedure details: wound care instructions given    Traumatic injury R great toenail Benign, observe.     Lentigines - Scattered tan macules - Discussed due to sun exposure - Benign, observe - Call for any changes  Seborrheic Keratoses - Stuck-on, waxy, tan-brown papules and plaques  - Discussed  benign etiology and prognosis. - Observe - Call for any changes  Melanocytic Nevi - Tan-brown and/or pink-flesh-colored symmetric macules and papules - Benign appearing on exam today - Observation - Call clinic  for new or changing moles - Recommend daily use of broad spectrum spf 30+ sunscreen to sun-exposed areas.   Hemangiomas - Red papules - Discussed benign nature - Observe - Call for any changes  Actinic Damage - Chronic, secondary to cumulative UV/sun exposure - diffuse scaly erythematous macules with underlying dyspigmentation - Recommend daily broad spectrum sunscreen SPF 30+ to sun-exposed areas, reapply every 2 hours as needed.  - Call for new or changing lesions.  Skin cancer screening performed today.  Return in about 6 months (around 09/16/2020) for TBSE, Hx of Melanoma, Melnaoma IS.  I, Sonya Hupman, RMA, am acting as scribe for Sarina Ser, MD .  Documentation: I have reviewed the above documentation for accuracy and completeness, and I agree with the above.  Sarina Ser, MD

## 2020-03-24 ENCOUNTER — Encounter: Payer: Self-pay | Admitting: Dermatology

## 2020-04-27 ENCOUNTER — Ambulatory Visit: Payer: BC Managed Care – PPO | Admitting: Podiatry

## 2020-04-29 ENCOUNTER — Encounter: Payer: Self-pay | Admitting: Podiatry

## 2020-04-29 ENCOUNTER — Other Ambulatory Visit: Payer: Self-pay

## 2020-04-29 ENCOUNTER — Ambulatory Visit (INDEPENDENT_AMBULATORY_CARE_PROVIDER_SITE_OTHER): Payer: BC Managed Care – PPO | Admitting: Podiatry

## 2020-04-29 DIAGNOSIS — M722 Plantar fascial fibromatosis: Secondary | ICD-10-CM

## 2020-04-29 MED ORDER — METHYLPREDNISOLONE 4 MG PO TBPK: ORAL_TABLET | ORAL | 0 refills | Status: DC

## 2020-04-29 MED ORDER — TRIAMCINOLONE ACETONIDE 40 MG/ML IJ SUSP
40.0000 mg | Freq: Once | INTRAMUSCULAR | Status: AC
  Administered 2020-04-29: 40 mg

## 2020-04-29 MED ORDER — MELOXICAM 15 MG PO TABS: 15.0000 mg | ORAL_TABLET | Freq: Every day | ORAL | 3 refills | Status: DC

## 2020-04-29 NOTE — Patient Instructions (Signed)

## 2020-04-29 NOTE — Progress Notes (Signed)
She presents today for follow-up of her forefoot pain she states that is really a new problem she states that the plantar heel is really starting to bother me on the left foot continues to have pain in both feet, she states that it is aching now and is becoming worse.  Hurting along the side of the foot and up the back of the leg even causing cramps nocturnally.  She has tried different shoes and she feels like the shoes may even be making it worse.  Objective: Vital signs are stable she is alert and oriented x3.  Pulses are palpable.  Neurologic sensorium is intact deep tendon reflexes are intact muscle strength is normal and symmetrical bilateral.  Orthopedic evaluation demonstrates a rectus foot type pain on palpation medial calcaneal tubercles bilaterally no crepitation on range of motion of any of the joints.  She does have tenderness on palpation of the fourth and fifth tarsometatarsal joint she also has pain on palpation of the peroneal tendons and the gastrosoleus complex.  There is no swelling of the legs no redness.  There is negative Homans' sign.  Assessment: Planter fasciitis lateral compensatory pain forefoot metatarsalgia associated with compensation.  Peroneal tendinitis associated with compensation.  Plan: At this point were going to start treating the plantar fasciitis.  I injected her bilateral heels 20 mg Kenalog 5 mg Marcaine point maximal tenderness.  Tolerated procedure well without complications.  Placed in a plantar fascial brace to be followed by a single night splint to left.  Started her on a Medrol Dosepak to be followed by meloxicam.  Discussed appropriate shoe gear stretching exercises ice therapy and shoe gear modifications recommended a neutral new balance tennis shoe such as an 840 or 880.  If these fail to respond to treatment orthotics will be necessary.

## 2020-05-13 ENCOUNTER — Ambulatory Visit: Payer: BC Managed Care – PPO | Admitting: Orthotics

## 2020-05-13 ENCOUNTER — Other Ambulatory Visit: Payer: Self-pay

## 2020-05-13 DIAGNOSIS — M722 Plantar fascial fibromatosis: Secondary | ICD-10-CM

## 2020-05-13 DIAGNOSIS — M2012 Hallux valgus (acquired), left foot: Secondary | ICD-10-CM

## 2020-05-13 DIAGNOSIS — M7751 Other enthesopathy of right foot: Secondary | ICD-10-CM | POA: Diagnosis not present

## 2020-05-13 NOTE — Progress Notes (Signed)
Patient came into today for casting bilateral f/o to address plantar fasciitis.  Patient reports history of foot pain involving plantar aponeurosis.  Goal is to provide longitudinal arch support and correct any RF instability due to heel eversion/inversion.  Ultimate goal is to relieve tension at pf insertion calcaneal tuberosity.  Plan on semi-rigid device addressing heel stability and relieving PF tension.     LANGER

## 2020-06-10 ENCOUNTER — Other Ambulatory Visit: Payer: Self-pay

## 2020-06-10 ENCOUNTER — Ambulatory Visit (INDEPENDENT_AMBULATORY_CARE_PROVIDER_SITE_OTHER): Payer: BC Managed Care – PPO | Admitting: Podiatry

## 2020-06-10 DIAGNOSIS — M2012 Hallux valgus (acquired), left foot: Secondary | ICD-10-CM

## 2020-06-10 DIAGNOSIS — M722 Plantar fascial fibromatosis: Secondary | ICD-10-CM

## 2020-06-10 NOTE — Progress Notes (Signed)
Patient presents today for orthotic pick up. Patient voices no new complaints.   Orthotics were fitted to patient's feet. No discomfort and no rubbing. Patient satisfied with the orthotics.   Orthotics were dispensed to patient with instructions for break in wear and to call the office if any concerns or questions.  

## 2020-06-10 NOTE — Patient Instructions (Addendum)

## 2020-07-22 ENCOUNTER — Other Ambulatory Visit: Payer: BC Managed Care – PPO

## 2020-07-22 ENCOUNTER — Other Ambulatory Visit: Payer: Self-pay

## 2020-07-23 ENCOUNTER — Other Ambulatory Visit: Payer: Self-pay

## 2020-07-23 ENCOUNTER — Ambulatory Visit (INDEPENDENT_AMBULATORY_CARE_PROVIDER_SITE_OTHER): Payer: BC Managed Care – PPO | Admitting: Dermatology

## 2020-07-23 DIAGNOSIS — L578 Other skin changes due to chronic exposure to nonionizing radiation: Secondary | ICD-10-CM

## 2020-07-23 DIAGNOSIS — L820 Inflamed seborrheic keratosis: Secondary | ICD-10-CM

## 2020-07-23 NOTE — Patient Instructions (Addendum)

## 2020-07-23 NOTE — Progress Notes (Signed)
   Follow-Up Visit   Subjective  Heidi Watson is a 55 y.o. female who presents for the following: check scaly spot (L scalp, ~3wks, no symptoms).  The following portions of the chart were reviewed this encounter and updated as appropriate:   Tobacco  Allergies  Meds  Problems  Med Hx  Surg Hx  Fam Hx     Review of Systems:  No other skin or systemic complaints except as noted in HPI or Assessment and Plan.  Objective  Well appearing patient in no apparent distress; mood and affect are within normal limits.  A focused examination was performed including scalp. Relevant physical exam findings are noted in the Assessment and Plan.  Objective  L lat vertex scalp x 1: Erythematous keratotic or waxy stuck-on papule or plaque.    Assessment & Plan  Inflamed seborrheic keratosis L lat vertex scalp x 1  Destruction of lesion - L lat vertex scalp x 1 Complexity: simple   Destruction method: cryotherapy   Informed consent: discussed and consent obtained   Timeout:  patient name, date of birth, surgical site, and procedure verified Lesion destroyed using liquid nitrogen: Yes   Region frozen until ice ball extended beyond lesion: Yes   Outcome: patient tolerated procedure well with no complications   Post-procedure details: wound care instructions given    Actinic Damage - chronic, secondary to cumulative UV radiation exposure/sun exposure over time - diffuse scaly erythematous macules with underlying dyspigmentation - Recommend daily broad spectrum sunscreen SPF 30+ to sun-exposed areas, reapply every 2 hours as needed.  - Recommend staying in the shade or wearing long sleeves, sun glasses (UVA+UVB protection) and wide brim hats (4-inch brim around the entire circumference of the hat). - Call for new or changing lesions.  Return for as scheduled 09/21/20 for TBSE, Hx of Melanoma IS, Hx of Dysplastic nevi.  I, Othelia Pulling, RMA, am acting as scribe for Sarina Ser, MD  .  Documentation: I have reviewed the above documentation for accuracy and completeness, and I agree with the above.  Sarina Ser, MD

## 2020-07-28 ENCOUNTER — Encounter: Payer: Self-pay | Admitting: Dermatology

## 2020-09-21 ENCOUNTER — Ambulatory Visit (INDEPENDENT_AMBULATORY_CARE_PROVIDER_SITE_OTHER): Payer: BC Managed Care – PPO | Admitting: Dermatology

## 2020-09-21 ENCOUNTER — Other Ambulatory Visit: Payer: Self-pay

## 2020-09-21 DIAGNOSIS — Z86006 Personal history of melanoma in-situ: Secondary | ICD-10-CM | POA: Diagnosis not present

## 2020-09-21 DIAGNOSIS — L814 Other melanin hyperpigmentation: Secondary | ICD-10-CM

## 2020-09-21 DIAGNOSIS — Z85820 Personal history of malignant melanoma of skin: Secondary | ICD-10-CM | POA: Diagnosis not present

## 2020-09-21 DIAGNOSIS — Z1283 Encounter for screening for malignant neoplasm of skin: Secondary | ICD-10-CM

## 2020-09-21 DIAGNOSIS — Z86018 Personal history of other benign neoplasm: Secondary | ICD-10-CM

## 2020-09-21 DIAGNOSIS — D1800 Hemangioma unspecified site: Secondary | ICD-10-CM

## 2020-09-21 DIAGNOSIS — L820 Inflamed seborrheic keratosis: Secondary | ICD-10-CM

## 2020-09-21 DIAGNOSIS — L918 Other hypertrophic disorders of the skin: Secondary | ICD-10-CM

## 2020-09-21 DIAGNOSIS — L578 Other skin changes due to chronic exposure to nonionizing radiation: Secondary | ICD-10-CM

## 2020-09-21 DIAGNOSIS — L821 Other seborrheic keratosis: Secondary | ICD-10-CM

## 2020-09-21 DIAGNOSIS — D229 Melanocytic nevi, unspecified: Secondary | ICD-10-CM

## 2020-09-21 NOTE — Progress Notes (Signed)
Follow-Up Visit   Subjective  Heidi Watson is a 55 y.o. female who presents for the following: Annual Exam (History of Melanoma in situ and Dysplastic nevi - TBSE today). The patient presents for Total-Body Skin Exam (TBSE) for skin cancer screening and mole check.  The following portions of the chart were reviewed this encounter and updated as appropriate:   Tobacco  Allergies  Meds  Problems  Med Hx  Surg Hx  Fam Hx      Review of Systems:  No other skin or systemic complaints except as noted in HPI or Assessment and Plan.  Objective  Well appearing patient in no apparent distress; mood and affect are within normal limits.  A full examination was performed including scalp, head, eyes, ears, nose, lips, neck, chest, axillae, abdomen, back, buttocks, bilateral upper extremities, bilateral lower extremities, hands, feet, fingers, toes, fingernails, and toenails. All findings within normal limits unless otherwise noted below.  Neck/chest x 19 (19) Erythematous keratotic or waxy stuck-on papule or plaque.    Assessment & Plan   History of Melanoma - No evidence of recurrence today - No lymphadenopathy - Recommend regular full body skin exams - Recommend daily broad spectrum sunscreen SPF 30+ to sun-exposed areas, reapply every 2 hours as needed.  - Call if any new or changing lesions are noted between office visits  History of Melanoma in Situ - No evidence of recurrence today - Recommend regular full body skin exams - Recommend daily broad spectrum sunscreen SPF 30+ to sun-exposed areas, reapply every 2 hours as needed.  - Call if any new or changing lesions are noted between office visits  History of Dysplastic Nevi - No evidence of recurrence today - Recommend regular full body skin exams - Recommend daily broad spectrum sunscreen SPF 30+ to sun-exposed areas, reapply every 2 hours as needed.  - Call if any new or changing lesions are noted between office  visits  Lentigines - Scattered tan macules - Due to sun exposure - Benign-appering, observe - Recommend daily broad spectrum sunscreen SPF 30+ to sun-exposed areas, reapply every 2 hours as needed. - Call for any changes  Seborrheic Keratoses - Stuck-on, waxy, tan-brown papules and/or plaques  - Benign-appearing - Discussed benign etiology and prognosis. - Observe - Call for any changes  Melanocytic Nevi - Tan-brown and/or pink-flesh-colored symmetric macules and papules - Benign appearing on exam today - Observation - Call clinic for new or changing moles - Recommend daily use of broad spectrum spf 30+ sunscreen to sun-exposed areas.   Hemangiomas - Red papules - Discussed benign nature - Observe - Call for any changes  Acrochordons (Skin Tags) - Fleshy, skin-colored pedunculated papules - Benign appearing.  - Observe. - If desired, they can be removed with an in office procedure that is not covered by insurance. - Please call the clinic if you notice any new or changing lesions.  Actinic Damage - Chronic condition, secondary to cumulative UV/sun exposure - diffuse scaly erythematous macules with underlying dyspigmentation - Recommend daily broad spectrum sunscreen SPF 30+ to sun-exposed areas, reapply every 2 hours as needed.  - Staying in the shade or wearing long sleeves, sun glasses (UVA+UVB protection) and wide brim hats (4-inch brim around the entire circumference of the hat) are also recommended for sun protection.  - Call for new or changing lesions.  Skin cancer screening performed today.  Inflamed seborrheic keratosis Neck/chest x 19  Destruction of lesion - Neck/chest x 19 Complexity: simple  Destruction method: cryotherapy   Informed consent: discussed and consent obtained   Timeout:  patient name, date of birth, surgical site, and procedure verified Lesion destroyed using liquid nitrogen: Yes   Region frozen until ice ball extended beyond lesion:  Yes   Outcome: patient tolerated procedure well with no complications   Post-procedure details: wound care instructions given    Return in about 6 months (around 03/23/2021) for TBSE.  I, Ashok Cordia, CMA, am acting as scribe for Sarina Ser, MD .  Documentation: I have reviewed the above documentation for accuracy and completeness, and I agree with the above.  Sarina Ser, MD

## 2020-09-21 NOTE — Patient Instructions (Signed)

## 2020-09-22 ENCOUNTER — Encounter: Payer: Self-pay | Admitting: Dermatology

## 2021-03-04 IMAGING — MR MRI ABDOMEN WITH AND WITHOUT CONTRAST
17 series · 47 of 48 positions shown · IV contrast (Gadavist)
Comparison: No prior abdominal MRI. CT the abdomen and pelvis
03/15/2010. Abdominal ultrasound 11/01/2017.

CLINICAL DATA: 53-year-old female with history of liver cyst for
the past 5 years. Follow-up study.

EXAM:
MRI ABDOMEN WITHOUT AND WITH CONTRAST
TECHNIQUE: Multiplanar multisequence MR imaging of the abdomen was performed
both before and after the administration of intravenous contrast.
CONTRAST:  7.5 mL of Gadavist.

[Series 2: T2 · coronal · 6.0mm · 1.19mm/px · 2 of 30 slices shown (1 of 2)]
[im 1/30]
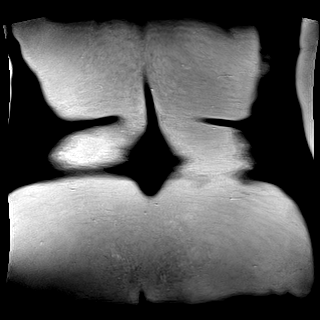
[im 30/30]
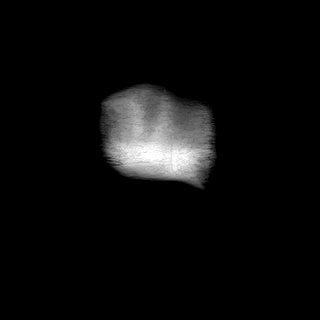

[Series 3: T2 · axial · 6.0mm · 1.19mm/px · z∈[-66,+157]mm · 2 of 32 slices shown (2 of 2)]
[im 1/32]
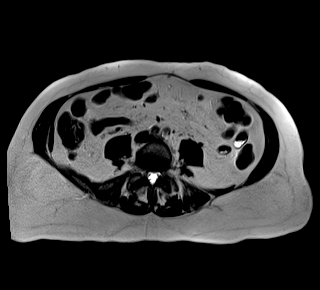
[im 32/32]
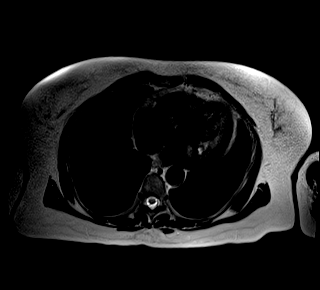

[Series 5: T2 fat-sat · axial · 6.0mm · 1.19mm/px · z∈[-66,+171]mm · 2 of 34 slices shown]
[im 1/34]
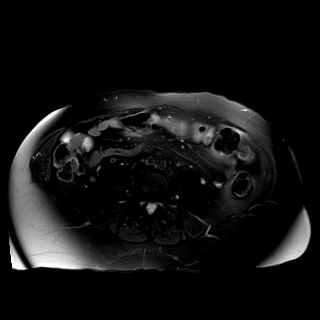
[im 34/34]
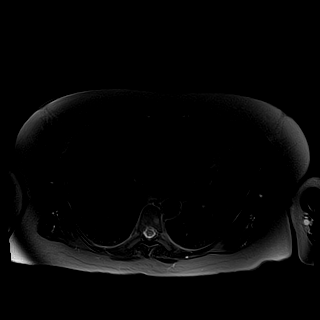

[Series 6: ax dwi_tracew · axial · 6.0mm · 1.42mm/px · z∈[-66,+171]mm · 5 of 102 slices shown]
[im 1/102]
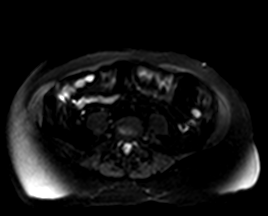
[im 26/102]
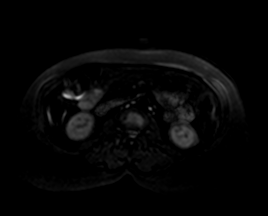
[im 51/102]
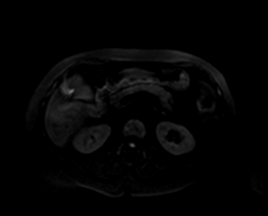
[im 76/102]
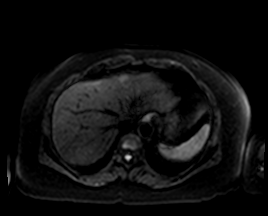
[im 102/102]
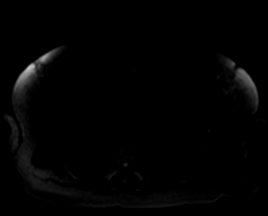

[Series 7: ax dwi_adc · axial · 6.0mm · 1.42mm/px · z∈[-66,+171]mm · 2 of 34 slices shown]
[im 1/34]
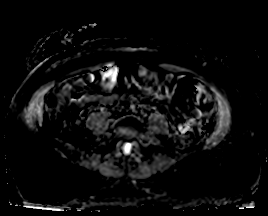
[im 34/34]
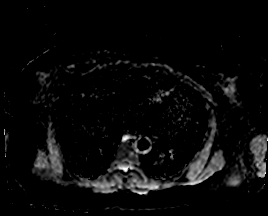

[Series 8: T1 · axial · 6.0mm · 0.74mm/px · z∈[-66,+157]mm · 4 of 64 slices shown]
[im 1/64]
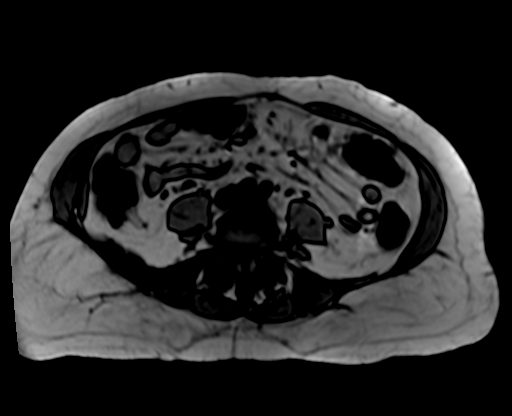
[im 22/64]
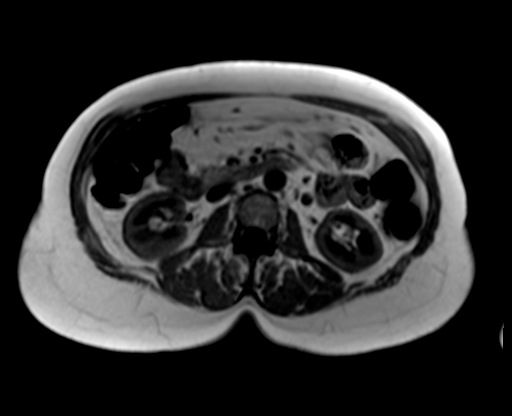
[im 43/64]
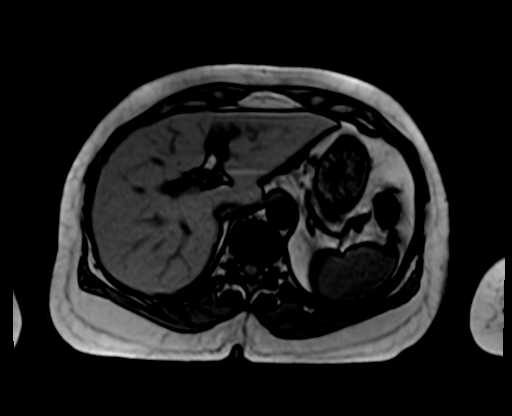
[im 64/64]
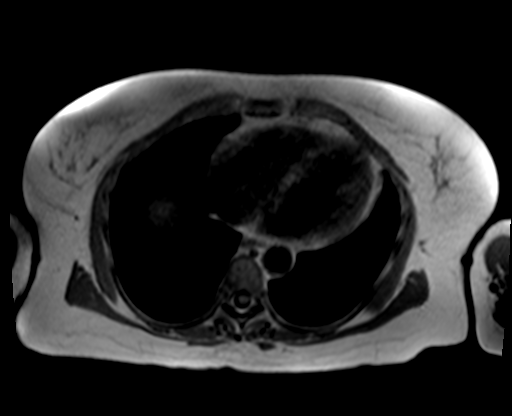

[Series 9: bSSFP · axial · 6.0mm · 0.74mm/px · 1 of 32 slices shown]
[im 1/32]
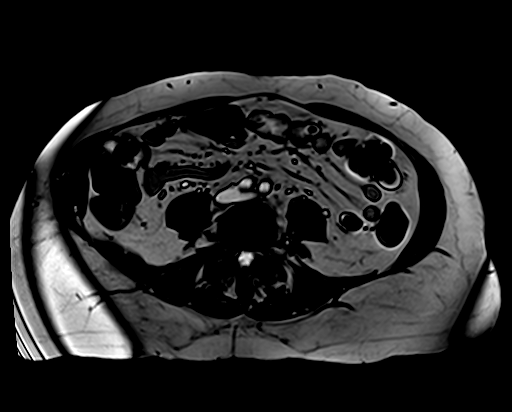

[Series 10: T1 dynamic fat-sat · axial · non-contrast · 3.0mm · 1.19mm/px · z∈[-73,+164]mm · 3 of 80 slices shown (1 of 5)]
[im 1/80]
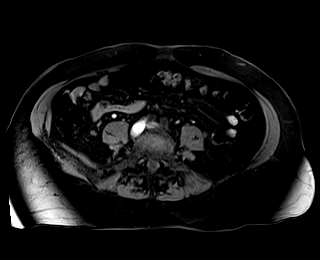
[im 40/80]
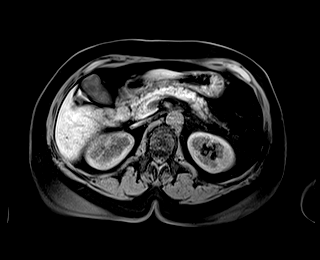
[im 80/80]
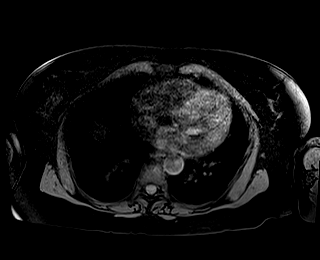

[Series 11: T1 dynamic fat-sat post-contrast · axial · 3.0mm · 1.19mm/px · z∈[-73,+164]mm · 3 of 80 slices shown (1 of 4)]
[im 1/80]
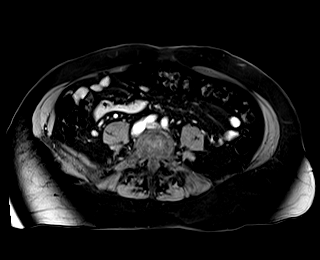
[im 40/80]
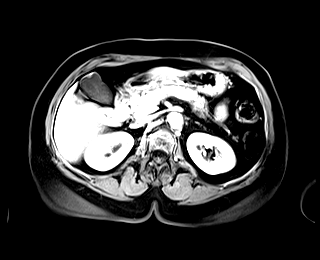
[im 80/80]
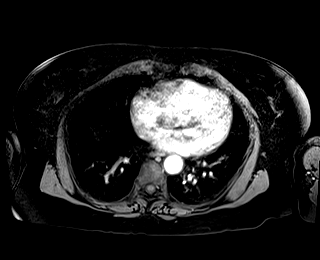

[Series 12: T1 dynamic fat-sat · axial · 3.0mm · 1.19mm/px · z∈[-73,+164]mm · 3 of 80 slices shown (2 of 5)]
[im 1/80]
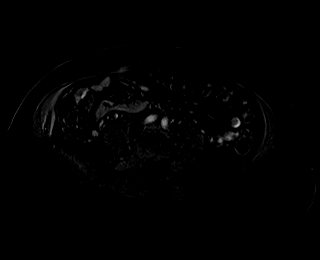
[im 40/80]
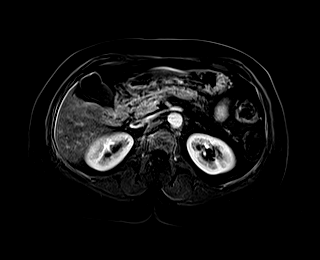
[im 80/80]
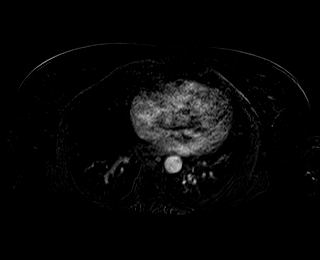

[Series 13: T1 dynamic fat-sat post-contrast · axial · 3.0mm · 1.19mm/px · z∈[-73,+164]mm · 3 of 80 slices shown (2 of 4)]
[im 1/80]
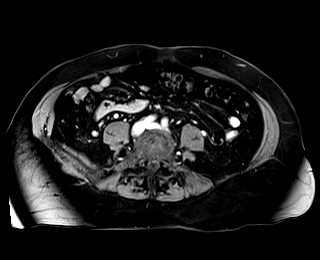
[im 40/80]
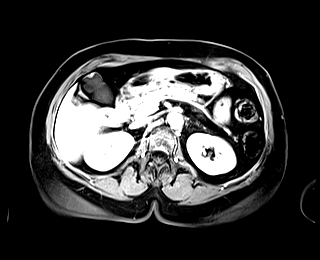
[im 80/80]
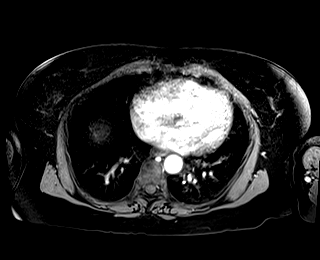

[Series 14: T1 dynamic fat-sat · axial · 3.0mm · 1.19mm/px · z∈[-73,+164]mm · 3 of 80 slices shown (3 of 5)]
[im 1/80]
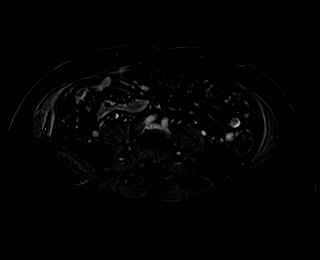
[im 40/80]
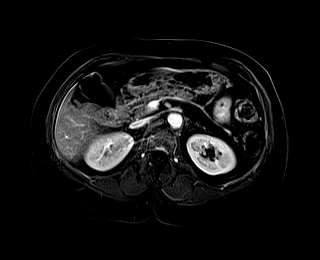
[im 80/80]
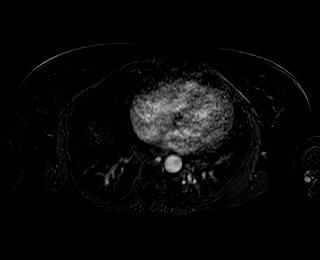

[Series 15: T1 dynamic fat-sat post-contrast · axial · 3.0mm · 1.19mm/px · z∈[-73,+164]mm · 3 of 80 slices shown (3 of 4)]
[im 1/80]
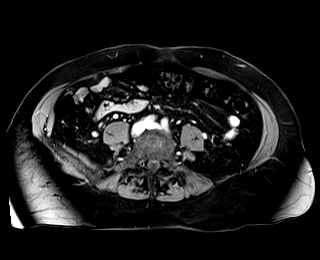
[im 40/80]
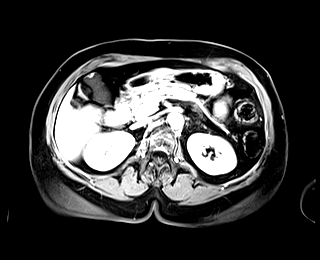
[im 80/80]
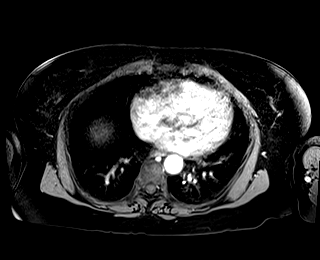

[Series 16: T1 dynamic fat-sat · axial · 3.0mm · 1.19mm/px · z∈[-73,+164]mm · 3 of 80 slices shown (4 of 5)]
[im 1/80]
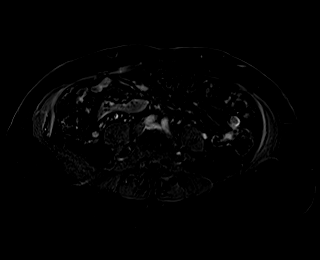
[im 40/80]
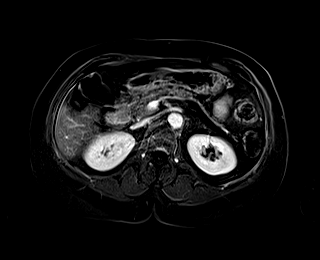
[im 80/80]
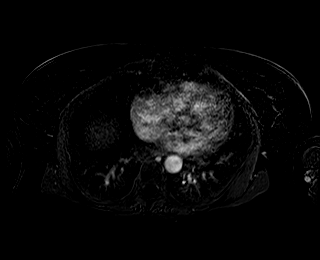

[Series 17: T1 dynamic post-contrast · coronal · 3.0mm · 1.31mm/px · 3 of 72 slices shown]
[im 1/72]
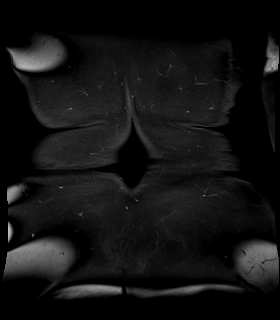
[im 36/72]
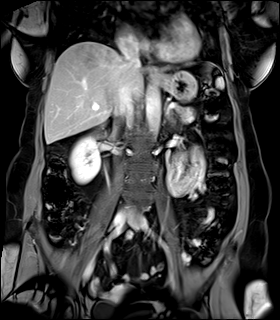
[im 72/72]
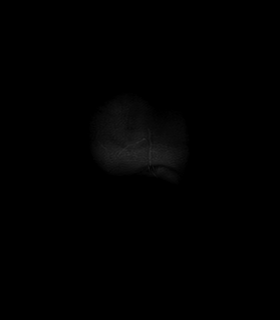

[Series 18: T1 dynamic fat-sat post-contrast · axial · 3.0mm · 1.19mm/px · z∈[-73,+164]mm · 3 of 80 slices shown (4 of 4)]
[im 1/80]
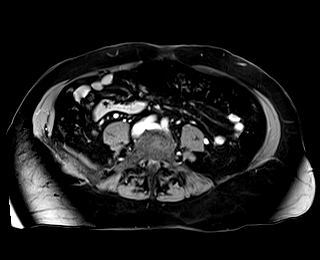
[im 40/80]
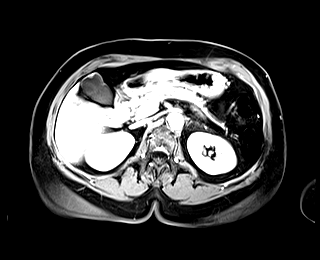
[im 80/80]
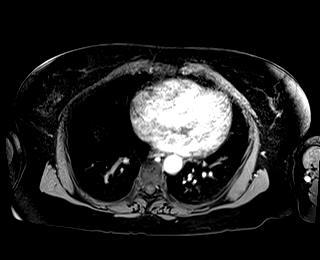

[Series 19: T1 dynamic fat-sat · axial · 3.0mm · 1.19mm/px · z∈[-73,+44]mm · 2 of 80 slices shown (5 of 5)]
[im 1/80]
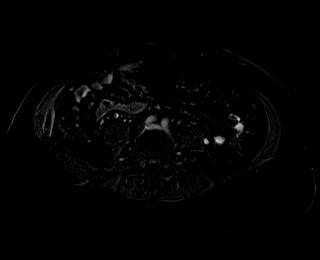
[im 40/80]
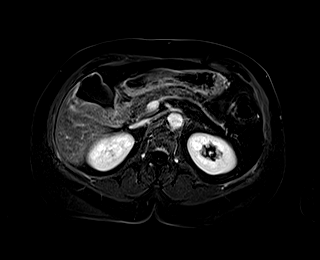

[47 of 48 positions shown; findings below may reference images not displayed]

FINDINGS: Lower chest: Unremarkable.

Hepatobiliary: There are 3 hepatic lesions which demonstrate T1
hypointensity, T2 hyperintensity and demonstrates some thin internal
septations without appreciable septal enhancement or mural
nodularity, compatible with hepatic cysts. The largest of these is
located between segments 2 and 3 (axial image 11 of series 5)
measuring 3.2 x 3.7 cm. No other suspicious hepatic lesions are
noted. No intra or extrahepatic biliary ductal dilatation.
Gallbladder is normal in appearance.

Pancreas: No pancreatic mass. No pancreatic ductal dilatation. No
pancreatic or peripancreatic fluid collections or inflammatory
changes.

Spleen:  Unremarkable.

Adrenals/Urinary Tract: Bilateral kidneys and adrenal glands are
normal in appearance. No hydroureteronephrosis in the visualized
portions of the abdomen.

Stomach/Bowel: Visualized portions are unremarkable.

Vascular/Lymphatic: Abdominal aortic atherosclerosis without
aneurysm identified in the visualized abdominal vasculature. No
lymphadenopathy noted in the visualized abdomen.

Other: No significant volume of ascites noted in the visualized
portions of the peritoneal cavity.

Musculoskeletal: No aggressive appearing osseous lesions are noted
in the visualized portions of the skeleton.
IMPRESSION: 1. Three mildly complex hepatic cysts which have a benign
appearance, as above.

## 2021-03-25 ENCOUNTER — Ambulatory Visit (INDEPENDENT_AMBULATORY_CARE_PROVIDER_SITE_OTHER): Payer: BC Managed Care – PPO | Admitting: Dermatology

## 2021-03-25 ENCOUNTER — Other Ambulatory Visit: Payer: Self-pay

## 2021-03-25 DIAGNOSIS — Z1283 Encounter for screening for malignant neoplasm of skin: Secondary | ICD-10-CM

## 2021-03-25 DIAGNOSIS — L409 Psoriasis, unspecified: Secondary | ICD-10-CM

## 2021-03-25 DIAGNOSIS — L719 Rosacea, unspecified: Secondary | ICD-10-CM | POA: Diagnosis not present

## 2021-03-25 DIAGNOSIS — L578 Other skin changes due to chronic exposure to nonionizing radiation: Secondary | ICD-10-CM | POA: Diagnosis not present

## 2021-03-25 DIAGNOSIS — L821 Other seborrheic keratosis: Secondary | ICD-10-CM

## 2021-03-25 DIAGNOSIS — Z86006 Personal history of melanoma in-situ: Secondary | ICD-10-CM

## 2021-03-25 DIAGNOSIS — D1800 Hemangioma unspecified site: Secondary | ICD-10-CM

## 2021-03-25 DIAGNOSIS — L570 Actinic keratosis: Secondary | ICD-10-CM

## 2021-03-25 DIAGNOSIS — D229 Melanocytic nevi, unspecified: Secondary | ICD-10-CM

## 2021-03-25 DIAGNOSIS — Z85820 Personal history of malignant melanoma of skin: Secondary | ICD-10-CM

## 2021-03-25 DIAGNOSIS — L814 Other melanin hyperpigmentation: Secondary | ICD-10-CM

## 2021-03-25 DIAGNOSIS — Z86018 Personal history of other benign neoplasm: Secondary | ICD-10-CM

## 2021-03-25 MED ORDER — ENSTILAR 0.005-0.064 % EX FOAM: CUTANEOUS | 6 refills | Status: DC

## 2021-03-25 MED ORDER — ZORYVE 0.3 % EX CREA: 1.0000 "application " | TOPICAL_CREAM | Freq: Every day | CUTANEOUS | 6 refills | Status: DC

## 2021-03-25 NOTE — Progress Notes (Signed)
Follow-Up Visit   Subjective  Heidi Watson is a 55 y.o. female who presents for the following: Follow-up (Patient here today for 6 month tbse. /). The patient presents for Total-Body Skin Exam (TBSE) for skin cancer screening and mole check.  The patient has spots, moles and lesions to be evaluated, some may be new or changing and the patient has concerns that these could be cancer.  The following portions of the chart were reviewed this encounter and updated as appropriate:  Tobacco   Allergies   Meds   Problems   Med Hx   Surg Hx   Fam Hx      Review of Systems: No other skin or systemic complaints except as noted in HPI or Assessment and Plan.  Objective  Well appearing patient in no apparent distress; mood and affect are within normal limits.  A full examination was performed including scalp, head, eyes, ears, nose, lips, neck, chest, axillae, abdomen, back, buttocks, bilateral upper extremities, bilateral lower extremities, hands, feet, fingers, toes, fingernails, and toenails. All findings within normal limits unless otherwise noted below.  Head - Anterior (Face) Mid face erythema   right cheek x 1 Erythematous thin papules/macules with gritty scale.   bilateral feet and bilateral lower legs Pink scaly patches at feet and bilateral lower legs          Assessment & Plan  Rosacea Head - Anterior (Face) Rosacea is a chronic progressive skin condition usually affecting the face of adults, causing redness and/or acne bumps. It is treatable but not curable. It sometimes affects the eyes (ocular rosacea) as well. It may respond to topical and/or systemic medication and can flare with stress, sun exposure, alcohol, exercise and some foods.  Daily application of broad spectrum spf 30+ sunscreen to face is recommended to reduce flares. Discussed the treatment option of BBL/laser.  Typically we recommend 1-3 treatment sessions about 5-8 weeks apart for best results.  The patient's  condition may require "maintenance treatments" in the future.  The fee for BBL / laser treatments is $350 per treatment session for the whole face.  A fee can be quoted for other parts of the body. Insurance typically does not pay for BBL/laser treatments and therefore the fee is an out-of-pocket cost.  Patient has had laser treatment in past  Actinic keratosis right cheek x 1  Actinic keratoses are precancerous spots that appear secondary to cumulative UV radiation exposure/sun exposure over time. They are chronic with expected duration over 1 year. A portion of actinic keratoses will progress to squamous cell carcinoma of the skin. It is not possible to reliably predict which spots will progress to skin cancer and so treatment is recommended to prevent development of skin cancer.  Recommend daily broad spectrum sunscreen SPF 30+ to sun-exposed areas, reapply every 2 hours as needed.  Recommend staying in the shade or wearing long sleeves, sun glasses (UVA+UVB protection) and wide brim hats (4-inch brim around the entire circumference of the hat). Call for new or changing lesions.  Destruction of lesion - right cheek x 1 Complexity: simple   Destruction method: cryotherapy   Informed consent: discussed and consent obtained   Timeout:  patient name, date of birth, surgical site, and procedure verified Lesion destroyed using liquid nitrogen: Yes   Region frozen until ice ball extended beyond lesion: Yes   Outcome: patient tolerated procedure well with no complications   Post-procedure details: wound care instructions given   Additional details:  Prior  to procedure, discussed risks of blister formation, small wound, skin dyspigmentation, or rare scar following cryotherapy. Recommend Vaseline ointment to treated areas while healing.   Psoriasis bilateral feet and bilateral lower legs  Psoriasis is a chronic non-curable, but treatable genetic/hereditary disease that may have other systemic  features affecting other organ systems such as joints (Psoriatic Arthritis). It is associated with an increased risk of inflammatory bowel disease, heart disease, non-alcoholic fatty liver disease, and depression.     Jaquita Rector  - negative for fungal elements at feet   Continue Enstilar 0.005 - 0.064 % apply foam topically to both feet  and rash at leg  in morning Monday through Friday.  Rx sent to [facility] Refill 6   Start Zoryve 0.3 % cream - apply topically to feet and rash at lower legs at nightly  [place] 6 rf Rx sent to [facility] 6 refills  Sample given in office Lot [id number] Exp 07/2022  Roflumilast (ZORYVE) 0.3 % CREA - bilateral feet and bilateral lower legs Apply 1 application topically daily. Apply to affected areas of feet and rash at lower legs  Related Medications triamcinolone cream (KENALOG) 0.1 % Apply 1 application topically 2 (two) times daily as needed.  Calcipotriene-Betameth Diprop (ENSTILAR) 0.005-0.064 % FOAM Apply to affected skin qd-bid prn  Calcipotriene-Betameth Diprop (ENSTILAR) 0.005-0.064 % FOAM Apply to the affected areas daily.  Skin cancer screening  Lentigines - Scattered tan macules - Due to sun exposure - Benign-appearing, observe - Recommend daily broad spectrum sunscreen SPF 30+ to sun-exposed areas, reapply every 2 hours as needed. - Call for any changes  Seborrheic Keratoses - Stuck-on, waxy, tan-brown papules and/or plaques  - Benign-appearing - Discussed benign etiology and prognosis. - Observe - Call for any changes  Melanocytic Nevi - Tan-brown and/or pink-flesh-colored symmetric macules and papules - Benign appearing on exam today - Observation - Call clinic for new or changing moles - Recommend daily use of broad spectrum spf 30+ sunscreen to sun-exposed areas.   Hemangiomas - Red papules - Discussed benign nature - Observe - Call for any changes  Actinic Damage - Chronic condition, secondary to cumulative  UV/sun exposure - diffuse scaly erythematous macules with underlying dyspigmentation - Recommend daily broad spectrum sunscreen SPF 30+ to sun-exposed areas, reapply every 2 hours as needed.  - Staying in the shade or wearing long sleeves, sun glasses (UVA+UVB protection) and wide brim hats (4-inch brim around the entire circumference of the hat) are also recommended for sun protection.  - Call for new or changing lesions.  History of Dysplastic Nevi - No evidence of recurrence today - Recommend regular full body skin exams - Recommend daily broad spectrum sunscreen SPF 30+ to sun-exposed areas, reapply every 2 hours as needed.  - Call if any new or changing lesions are noted between office visits  History of Melanoma - No evidence of recurrence today right post prox lat thigh 2018 - No lymphadenopathy - Recommend regular full body skin exams - Recommend daily broad spectrum sunscreen SPF 30+ to sun-exposed areas, reapply every 2 hours as needed.  - Call if any new or changing lesions are noted between office visits  History of Melanoma in Situ - No evidence of recurrence today left distal tricep 2019 - Recommend regular full body skin exams - Recommend daily broad spectrum sunscreen SPF 30+ to sun-exposed areas, reapply every 2 hours as needed.  - Call if any new or changing lesions are noted between office visits  Skin cancer  screening performed today.  Return for 2 month psoriasis follow up, 6 month tbse . IRuthell Rummage, CMA, am acting as scribe for Sarina Ser, MD. Documentation: I have reviewed the above documentation for accuracy and completeness, and I agree with the above.  Sarina Ser, MD

## 2021-03-25 NOTE — Patient Instructions (Addendum)
Apply to legs and feet   Enstilar foam - topically each morning Monday thru Friday.   Zoryve Apply to feet and rash at legs daily.        Actinic keratoses are precancerous spots that appear secondary to cumulative UV radiation exposure/sun exposure over time. They are chronic with expected duration over 1 year. A portion of actinic keratoses will progress to squamous cell carcinoma of the skin. It is not possible to reliably predict which spots will progress to skin cancer and so treatment is recommended to prevent development of skin cancer.  Recommend daily broad spectrum sunscreen SPF 30+ to sun-exposed areas, reapply every 2 hours as needed.  Recommend staying in the shade or wearing long sleeves, sun glasses (UVA+UVB protection) and wide brim hats (4-inch brim around the entire circumference of the hat). Call for new or changing lesions.   Cryotherapy Aftercare  Wash gently with soap and water everyday.   Apply Vaseline and Band-Aid daily until healed.     Melanoma ABCDEs  Melanoma is the most dangerous type of skin cancer, and is the leading cause of death from skin disease.  You are more likely to develop melanoma if you: Have light-colored skin, light-colored eyes, or red or blond hair Spend a lot of time in the sun Tan regularly, either outdoors or in a tanning bed Have had blistering sunburns, especially during childhood Have a close family member who has had a melanoma Have atypical moles or large birthmarks  Early detection of melanoma is key since treatment is typically straightforward and cure rates are extremely high if we catch it early.   The first sign of melanoma is often a change in a mole or a new dark spot.  The ABCDE system is a way of remembering the signs of melanoma.  A for asymmetry:  The two halves do not match. B for border:  The edges of the growth are irregular. C for color:  A mixture of colors are present instead of an even brown color. D for  diameter:  Melanomas are usually (but not always) greater than 45mm - the size of a pencil eraser. E for evolution:  The spot keeps changing in size, shape, and color.  Please check your skin once per month between visits. You can use a small mirror in front and a large mirror behind you to keep an eye on the back side or your body.   If you see any new or changing lesions before your next follow-up, please call to schedule a visit.  Please continue daily skin protection including broad spectrum sunscreen SPF 30+ to sun-exposed areas, reapplying every 2 hours as needed when you're outdoors.   Staying in the shade or wearing long sleeves, sun glasses (UVA+UVB protection) and wide brim hats (4-inch brim around the entire circumference of the hat) are also recommended for sun protection.    If You Need Anything After Your Visit  If you have any questions or concerns for your doctor, please call our main line at [phone] and press option 4 to reach your doctor's medical assistant. If no one answers, please leave a voicemail as directed and we will return your call as soon as possible. Messages left after 4 pm will be answered the following business day.   You may also send [place] a message via [place]. We typically respond to MyChart messages within 1-2 business days.  For prescription refills, please ask your pharmacy to contact our office. Our fax number  is [phone].  If you have an urgent issue when the clinic is closed that cannot wait until the next business day, you can page your doctor at the number below.    Please note that while we do our best to be available for urgent issues outside of office hours, we are not available 24/7.   If you have an urgent issue and are unable to reach [place], you may choose to seek medical care at your doctor's office, retail clinic, urgent care center, or emergency room.  If you have a medical emergency, please immediately call 911 or go to the emergency  department.  Pager Numbers  - Dr. Nehemiah Massed: [phone]  - Dr. Laurence Ferrari: [phone]  - Dr. Nicole Kindred: [phone]  In the event of inclement weather, please call our main line at [phone] for an update on the status of any delays or closures.  Dermatology Medication Tips: Please keep the boxes that topical medications come in in order to help keep track of the instructions about where and how to use these. Pharmacies typically print the medication instructions only on the boxes and not directly on the medication tubes.   If your medication is too expensive, please contact our office at [phone] option 4 or send [place] a message through [place].   We are unable to tell what your co-pay for medications will be in advance as this is different depending on your insurance coverage. However, we may be able to find a substitute medication at lower cost or fill out paperwork to get insurance to cover a needed medication.   If a prior authorization is required to get your medication covered by your insurance company, please allow [place] 1-2 business days to complete this process.  Drug prices often vary depending on where the prescription is filled and some pharmacies may offer cheaper prices.  The website www.goodrx.com contains coupons for medications through different pharmacies. The prices here do not account for what the cost may be with help from insurance (it may be cheaper with your insurance), but the website can give you the price if you did not use any insurance.  - You can print the associated coupon and take it with your prescription to the pharmacy.  - You may also stop by our office during regular business hours and pick up a GoodRx coupon card.  - If you need your prescription sent electronically to a different pharmacy, notify our office through [facility] or by phone at [phone] option 4.     Si Usted Necesita Algo Despus de Su Visita  Tambin puede enviarnos un  mensaje a travs de Pharmacist, community. Por lo general respondemos a los mensajes de MyChart en el transcurso de 1 a 2 das hbiles.  Para renovar recetas, por favor pida a su farmacia que se ponga en contacto con nuestra oficina. Harland Dingwall de fax es [place] [phone].  Si tiene un asunto urgente cuando la clnica est cerrada y que no puede esperar hasta el siguiente da hbil, puede llamar/localizar a su doctor(a) al nmero que aparece a continuacin.   Por favor, tenga en cuenta que aunque hacemos todo lo posible para estar disponibles para asuntos urgentes fuera del horario de [place], no estamos disponibles las 24 horas del da, los 7 das de la [place].   Si tiene un problema urgente y no puede comunicarse con nosotros, puede optar por buscar atencin mdica  en el consultorio de su doctor(a), en una clnica privada, en un centro de atencin  urgente o en una sala de emergencias.  Si tiene Engineering geologist, por favor llame inmediatamente al 911 o vaya a la sala de emergencias.  Nmeros de bper  - Dr. Nehemiah Massed: [phone]  - Dra. Moye: [phone]  - Dra. Nicole Kindred: [phone]  En caso de inclemencias del [place], por favor llame a Johnsie Kindred principal al [phone] para una actualizacin sobre el [place] de cualquier retraso o cierre.  Consejos para la medicacin en dermatologa: Por favor, guarde las cajas en las que vienen los medicamentos de uso tpico para ayudarle a seguir las instrucciones sobre dnde y cmo usarlos. Las farmacias generalmente imprimen las instrucciones del medicamento slo en las cajas y no directamente en los tubos del [place].   Si su medicamento es muy caro, por favor, pngase en contacto con Zigmund Daniel llamando al [phone] y presione la opcin 4 o envenos un mensaje a travs de Pharmacist, community.   No podemos decirle cul ser su copago por los medicamentos por adelantado ya que esto es diferente dependiendo de la cobertura de su seguro. Sin embargo,  es posible que podamos encontrar un medicamento sustituto a Electrical engineer un formulario para que el seguro cubra el medicamento que se considera necesario.   Si se requiere una autorizacin previa para que su compaa de seguros [place] su medicamento, por favor permtanos de 1 a 2 das hbiles para completar este proceso.  Los precios de los medicamentos varan con frecuencia dependiendo del Environmental consultant de dnde se surte la receta y alguna farmacias pueden ofrecer precios ms baratos.  El sitio web www.goodrx.com tiene cupones para medicamentos de Airline pilot. Los precios aqu no tienen en cuenta lo que podra costar con la ayuda del seguro (puede ser ms barato con su seguro), pero el sitio web puede darle el precio si no utiliz Research scientist (physical sciences).  - Puede imprimir el cupn correspondiente y llevarlo con su receta a la farmacia.  - Tambin puede pasar por nuestra oficina durante el horario de atencin regular y Charity fundraiser una tarjeta de cupones de GoodRx.  - Si necesita que su receta se enve electrnicamente a una farmacia diferente, informe a nuestra oficina a travs de MyChart de [place] o por telfono llamando al [phone] y presione la opcin 4.

## 2021-03-30 ENCOUNTER — Encounter: Payer: Self-pay | Admitting: Dermatology

## 2021-07-07 ENCOUNTER — Other Ambulatory Visit: Payer: Self-pay

## 2021-07-07 ENCOUNTER — Ambulatory Visit (INDEPENDENT_AMBULATORY_CARE_PROVIDER_SITE_OTHER): Payer: BC Managed Care – PPO | Admitting: Dermatology

## 2021-07-07 DIAGNOSIS — L578 Other skin changes due to chronic exposure to nonionizing radiation: Secondary | ICD-10-CM

## 2021-07-07 DIAGNOSIS — L409 Psoriasis, unspecified: Secondary | ICD-10-CM

## 2021-07-07 DIAGNOSIS — L820 Inflamed seborrheic keratosis: Secondary | ICD-10-CM

## 2021-07-07 NOTE — Progress Notes (Signed)
? ?  Follow-Up Visit ?  ?Subjective  ?Heidi Watson is a 56 y.o. female who presents for the following: Psoriasis (3 months f/u psoriasis on her feet treating with Enstilar foam and Zoryve cream ) and Skin Problem (Check spot on her left cheek growing and changing). ?The patient has spots, moles and lesions to be evaluated, some may be new or changing and the patient has concerns that these could be cancer. ? ?The following portions of the chart were reviewed this encounter and updated as appropriate:  ? Tobacco  Allergies  Meds  Problems  Med Hx  Surg Hx  Fam Hx   ?  ?Review of Systems:  No other skin or systemic complaints except as noted in HPI or Assessment and Plan. ? ?Objective  ?Well appearing patient in no apparent distress; mood and affect are within normal limits. ? ?A focused examination was performed including extremities, including the arms, hands, fingers, and fingernails and the legs, feet, toes, and toenails and face,feet. Relevant physical exam findings are noted in the Assessment and Plan. ? ?feet ?Well-demarcated erythematous papules/plaques with silvery scale, guttate pink scaly papules.  ? ?left cheek x 1 ?Stuck-on, waxy, tan-brown papule  ? ? ?Assessment & Plan  ?Psoriasis ?feet ?Much improved on treatment. ?Psoriasis is a chronic non-curable, but treatable genetic/hereditary disease that may have other systemic features affecting other organ systems such as joints (Psoriatic Arthritis). It is associated with an increased risk of inflammatory bowel disease, heart disease, non-alcoholic fatty liver disease, and depression.    ? ?Decrease Enstilar foam apply to feet 3 days a week  ?Cont Zoryve cream apply to feet at bedtime  ? ?Related Medications ?Calcipotriene-Betameth Diprop (ENSTILAR) 0.005-0.064 % FOAM ?Apply to the affected areas daily. ? ?Roflumilast (ZORYVE) 0.3 % CREA ?Apply 1 application topically daily. Apply to affected areas of feet and rash at lower legs ? ?Inflamed seborrheic  keratosis ?left cheek x 1 ?Reassured benign age-related growth.  Recommend observation.  Irritated.  Patient desires treatment. ?Destruction of lesion - left cheek x 1 ?Complexity: simple   ?Destruction method: cryotherapy   ?Informed consent: discussed and consent obtained   ?Timeout:  patient name, date of birth, surgical site, and procedure verified ?Lesion destroyed using liquid nitrogen: Yes   ?Region frozen until ice ball extended beyond lesion: Yes   ?Outcome: patient tolerated procedure well with no complications   ?Post-procedure details: wound care instructions given   ? ?Actinic Damage ?- chronic, secondary to cumulative UV radiation exposure/sun exposure over time ?- diffuse scaly erythematous macules with underlying dyspigmentation ?- Recommend daily broad spectrum sunscreen SPF 30+ to sun-exposed areas, reapply every 2 hours as needed.  ?- Recommend staying in the shade or wearing long sleeves, sun glasses (UVA+UVB protection) and wide brim hats (4-inch brim around the entire circumference of the hat). ?- Call for new or changing lesions. ? ?Return for as scheduled in June 21 for TBSE . ? ?I, Marye Round, CMA, am acting as scribe for Sarina Ser, MD .  ?Documentation: I have reviewed the above documentation for accuracy and completeness, and I agree with the above. ? ?Sarina Ser, MD ? ?

## 2021-07-07 NOTE — Patient Instructions (Addendum)

## 2021-07-08 ENCOUNTER — Encounter: Payer: Self-pay | Admitting: Dermatology

## 2021-09-29 ENCOUNTER — Ambulatory Visit (INDEPENDENT_AMBULATORY_CARE_PROVIDER_SITE_OTHER): Payer: BC Managed Care – PPO | Admitting: Dermatology

## 2021-09-29 DIAGNOSIS — Z86018 Personal history of other benign neoplasm: Secondary | ICD-10-CM

## 2021-09-29 DIAGNOSIS — Z86006 Personal history of melanoma in-situ: Secondary | ICD-10-CM | POA: Diagnosis not present

## 2021-09-29 DIAGNOSIS — L578 Other skin changes due to chronic exposure to nonionizing radiation: Secondary | ICD-10-CM

## 2021-09-29 DIAGNOSIS — L720 Epidermal cyst: Secondary | ICD-10-CM

## 2021-09-29 DIAGNOSIS — D229 Melanocytic nevi, unspecified: Secondary | ICD-10-CM

## 2021-09-29 DIAGNOSIS — Z1283 Encounter for screening for malignant neoplasm of skin: Secondary | ICD-10-CM | POA: Diagnosis not present

## 2021-09-29 DIAGNOSIS — L814 Other melanin hyperpigmentation: Secondary | ICD-10-CM

## 2021-09-29 DIAGNOSIS — Z85820 Personal history of malignant melanoma of skin: Secondary | ICD-10-CM

## 2021-09-29 DIAGNOSIS — L918 Other hypertrophic disorders of the skin: Secondary | ICD-10-CM

## 2021-09-29 DIAGNOSIS — L820 Inflamed seborrheic keratosis: Secondary | ICD-10-CM

## 2021-09-29 DIAGNOSIS — L821 Other seborrheic keratosis: Secondary | ICD-10-CM

## 2021-09-29 DIAGNOSIS — L719 Rosacea, unspecified: Secondary | ICD-10-CM | POA: Diagnosis not present

## 2021-09-29 DIAGNOSIS — D1800 Hemangioma unspecified site: Secondary | ICD-10-CM

## 2021-09-29 DIAGNOSIS — L409 Psoriasis, unspecified: Secondary | ICD-10-CM

## 2021-09-29 MED ORDER — ZORYVE 0.3 % EX CREA: 1.0000 "application " | TOPICAL_CREAM | Freq: Every day | CUTANEOUS | 6 refills | Status: DC

## 2021-09-29 NOTE — Progress Notes (Signed)
Follow-Up Visit   Subjective  Heidi Watson is a 56 y.o. female who presents for the following: Annual Exam (Hx MM, MMIS, dysplastic nevi ) and Psoriasis (Patient has been flaring on the hands and elbows she has Enstilar foam and Zoryve cream but she has been out of the medications and needs refills). The patient presents for Total-Body Skin Exam (TBSE) for skin cancer screening and mole check.  The patient has spots, moles and lesions to be evaluated, some may be new or changing.  The following portions of the chart were reviewed this encounter and updated as appropriate:   Tobacco  Allergies  Meds  Problems  Med Hx  Surg Hx  Fam Hx     Review of Systems:  No other skin or systemic complaints except as noted in HPI or Assessment and Plan.  Objective  Well appearing patient in no apparent distress; mood and affect are within normal limits.  A full examination was performed including scalp, head, eyes, ears, nose, lips, neck, chest, axillae, abdomen, back, buttocks, bilateral upper extremities, bilateral lower extremities, hands, feet, fingers, toes, fingernails, and toenails. All findings within normal limits unless otherwise noted below.  Face Mid face erythema with telangiectasias +/- scattered inflammatory papules.   B/L hands and elbows Peeling and fissures on the fingers. Scale of the elbows.  L prox calf x 1, L dorsum foot x 1, R thigh x 1, L forearm x 2 (5) Erythematous stuck-on, waxy papule or plaque  Left Breast 0.4 cm firm papule.   Assessment & Plan  Rosacea Face Rosacea is a chronic progressive skin condition usually affecting the face of adults, causing redness and/or acne bumps. It is treatable but not curable. It sometimes affects the eyes (ocular rosacea) as well. It may respond to topical and/or systemic medication and can flare with stress, sun exposure, alcohol, exercise and some foods.  Daily application of broad spectrum spf 30+ sunscreen to face is  recommended to reduce flares. Pt declines treatment.  Psoriasis B/L hands and elbows Chronic and persistent condition with duration or expected duration over one year. Condition is bothersome/symptomatic for patient. Currently flared due to being out of medications.  Psoriasis is a chronic non-curable, but treatable genetic/hereditary disease that may have other systemic features affecting other organ systems such as joints (Psoriatic Arthritis). It is associated with an increased risk of inflammatory bowel disease, heart disease, non-alcoholic fatty liver disease, and depression.    Continue Zoryve cream to aa's QD and Enstilar foam QD 5d/wk prn active affected areas.  Related Medications Calcipotriene-Betameth Diprop (ENSTILAR) 0.005-0.064 % FOAM Apply to the affected areas daily. Roflumilast (ZORYVE) 0.3 % CREA Apply 1 application  topically daily. Apply to affected areas of feet and rash at lower legs  Inflamed seborrheic keratosis L prox calf x 1, L dorsum foot x 1, R thigh x 1, L forearm x 2 Symptomatic, irritating, patient would like treated. Destruction of lesion - L prox calf x 1, L dorsum foot x 1, R thigh x 1, L forearm x 2 Complexity: simple   Destruction method: cryotherapy   Informed consent: discussed and consent obtained   Timeout:  patient name, date of birth, surgical site, and procedure verified Lesion destroyed using liquid nitrogen: Yes   Region frozen until ice ball extended beyond lesion: Yes   Outcome: patient tolerated procedure well with no complications   Post-procedure details: wound care instructions given    Epidermal inclusion cyst Left Breast Vs dermatofibroma - Benign-appearing.  Exam most consistent with an epidermal inclusion cyst. Discussed that a cyst is a benign growth that can grow over time and sometimes get irritated or inflamed. Recommend observation if it is not bothersome. Discussed option of surgical excision to remove it if it is growing,  symptomatic, or other changes noted. Please call for new or changing lesions so they can be evaluated.  Lentigines - Scattered tan macules - Due to sun exposure - Benign-appearing, observe - Recommend daily broad spectrum sunscreen SPF 30+ to sun-exposed areas, reapply every 2 hours as needed. - Call for any changes  Seborrheic Keratoses - Stuck-on, waxy, tan-brown papules and/or plaques  - Benign-appearing - Discussed benign etiology and prognosis. - Observe - Call for any changes  Melanocytic Nevi - Tan-brown and/or pink-flesh-colored symmetric macules and papules - Benign appearing on exam today - Observation - Call clinic for new or changing moles - Recommend daily use of broad spectrum spf 30+ sunscreen to sun-exposed areas.   Hemangiomas - Red papules - Discussed benign nature - Observe - Call for any changes  Actinic Damage - Chronic condition, secondary to cumulative UV/sun exposure - diffuse scaly erythematous macules with underlying dyspigmentation - Recommend daily broad spectrum sunscreen SPF 30+ to sun-exposed areas, reapply every 2 hours as needed.  - Staying in the shade or wearing long sleeves, sun glasses (UVA+UVB protection) and wide brim hats (4-inch brim around the entire circumference of the hat) are also recommended for sun protection.  - Call for new or changing lesions.  History of Melanoma - No evidence of recurrence today - No lymphadenopathy - Recommend regular full body skin exams - Recommend daily broad spectrum sunscreen SPF 30+ to sun-exposed areas, reapply every 2 hours as needed.  - Call if any new or changing lesions are noted between office visits  History of Melanoma in Situ - No evidence of recurrence today - Recommend regular full body skin exams - Recommend daily broad spectrum sunscreen SPF 30+ to sun-exposed areas, reapply every 2 hours as needed.  - Call if any new or changing lesions are noted between office visits  History  of Dysplastic Nevi - No evidence of recurrence today - Recommend regular full body skin exams - Recommend daily broad spectrum sunscreen SPF 30+ to sun-exposed areas, reapply every 2 hours as needed.  - Call if any new or changing lesions are noted between office visits  Acrochordons (Skin Tags) - Fleshy, skin-colored pedunculated papules - Benign appearing.  - Observe. - If desired, they can be removed with an in office procedure that is not covered by insurance. - Please call the clinic if you notice any new or changing lesions.  Skin cancer screening performed today.  Return in about 6 months (around 03/31/2022) for TBSE.  Luther Redo, CMA, am acting as scribe for Sarina Ser, MD . Documentation: I have reviewed the above documentation for accuracy and completeness, and I agree with the above.  Sarina Ser, MD

## 2021-09-29 NOTE — Patient Instructions (Signed)
Due to recent changes in healthcare laws, you may see results of your pathology and/or laboratory studies on MyChart before the doctors have had a chance to review them. We understand that in some cases there may be results that are confusing or concerning to you. Please understand that not all results are received at the same time and often the doctors may need to interpret multiple results in order to provide you with the best plan of care or course of treatment. Therefore, we ask that you please give us 2 business days to thoroughly review all your results before contacting the office for clarification. Should we see a critical lab result, you will be contacted sooner.   If You Need Anything After Your Visit  If you have any questions or concerns for your doctor, please call our main line at [phone] and press option 4 to reach your doctor's medical assistant. If no one answers, please leave a voicemail as directed and we will return your call as soon as possible. Messages left after 4 pm will be answered the following business day.   You may also send us a message via MyChart. We typically respond to MyChart messages within 1-2 business days.  For prescription refills, please ask your pharmacy to contact our office. Our fax number is [phone].  If you have an urgent issue when the clinic is closed that cannot wait until the next business day, you can page your doctor at the number below.    Please note that while we do our best to be available for urgent issues outside of office hours, we are not available 24/7.   If you have an urgent issue and are unable to reach us, you may choose to seek medical care at your doctor's office, retail clinic, urgent care center, or emergency room.  If you have a medical emergency, please immediately call 911 or go to the emergency department.  Pager Numbers  - Dr. Kowalski: [phone]  - Dr. Moye: [phone]  - Dr. Stewart:  [phone]  In the event of inclement weather, please call our main line at [phone] for an update on the status of any delays or closures.  Dermatology Medication Tips: Please keep the boxes that topical medications come in in order to help keep track of the instructions about where and how to use these. Pharmacies typically print the medication instructions only on the boxes and not directly on the medication tubes.   If your medication is too expensive, please contact our office at [phone] option 4 or send us a message through MyChart.   We are unable to tell what your co-pay for medications will be in advance as this is different depending on your insurance coverage. However, we may be able to find a substitute medication at lower cost or fill out paperwork to get insurance to cover a needed medication.   If a prior authorization is required to get your medication covered by your insurance company, please allow us 1-2 business days to complete this process.  Drug prices often vary depending on where the prescription is filled and some pharmacies may offer cheaper prices.  The website www.goodrx.com contains coupons for medications through different pharmacies. The prices here do not account for what the cost may be with help from insurance (it may be cheaper with your insurance), but the website can give you the price if you did not use any insurance.  - You can print the associated coupon and take it with   your prescription to the pharmacy.  - You may also stop by our office during regular business hours and pick up a GoodRx coupon card.  - If you need your prescription sent electronically to a different pharmacy, notify our office through [place] MyChart or by phone at [phone] option 4.     Si Usted Necesita Algo Despus de Su Visita  Tambin puede enviarnos un mensaje a travs de MyChart. Por lo general respondemos a los mensajes de MyChart en el transcurso de 1 a 2  das hbiles.  Para renovar recetas, por favor pida a su farmacia que se ponga en contacto con nuestra oficina. Nuestro nmero de fax es el [phone].  Si tiene un asunto urgente cuando la clnica est cerrada y que no puede esperar hasta el siguiente da hbil, puede llamar/localizar a su doctor(a) al nmero que aparece a continuacin.   Por favor, tenga en cuenta que aunque hacemos todo lo posible para estar disponibles para asuntos urgentes fuera del horario de oficina, no estamos disponibles las 24 horas del da, los 7 das de la semana.   Si tiene un problema urgente y no puede comunicarse con nosotros, puede optar por buscar atencin mdica  en el consultorio de su doctor(a), en una clnica privada, en un centro de atencin urgente o en una sala de emergencias.  Si tiene una emergencia mdica, por favor llame inmediatamente al 911 o vaya a la sala de emergencias.  Nmeros de bper  - Dr. Kowalski: [phone]  - Dra. Moye: [phone]  - Dra. Stewart: [phone]  En caso de inclemencias del tiempo, por favor llame a nuestra lnea principal al [phone] para una actualizacin sobre el estado de cualquier retraso o cierre.  Consejos para la medicacin en dermatologa: Por favor, guarde las cajas en las que vienen los medicamentos de uso tpico para ayudarle a seguir las instrucciones sobre dnde y cmo usarlos. Las farmacias generalmente imprimen las instrucciones del medicamento slo en las cajas y no directamente en los tubos del medicamento.   Si su medicamento es muy caro, por favor, pngase en contacto con nuestra oficina llamando al [phone] y presione la opcin 4 o envenos un mensaje a travs de MyChart.   No podemos decirle cul ser su copago por los medicamentos por adelantado ya que esto es diferente dependiendo de la cobertura de su seguro. Sin embargo, es posible que podamos encontrar un medicamento sustituto a menor costo o llenar un formulario para que el  seguro cubra el medicamento que se considera necesario.   Si se requiere una autorizacin previa para que su compaa de seguros cubra su medicamento, por favor permtanos de 1 a 2 das hbiles para completar este proceso.  Los precios de los medicamentos varan con frecuencia dependiendo del lugar de dnde se surte la receta y alguna farmacias pueden ofrecer precios ms baratos.  El sitio web www.goodrx.com tiene cupones para medicamentos de diferentes farmacias. Los precios aqu no tienen en cuenta lo que podra costar con la ayuda del seguro (puede ser ms barato con su seguro), pero el sitio web puede darle el precio si no utiliz ningn seguro.  - Puede imprimir el cupn correspondiente y llevarlo con su receta a la farmacia.  - Tambin puede pasar por nuestra oficina durante el horario de atencin regular y recoger una tarjeta de cupones de GoodRx.  - Si necesita que su receta se enve electrnicamente a una farmacia diferente, informe a nuestra oficina a travs de MyChart de [place]   o por telfono llamando al [phone] y presione la opcin 4.  

## 2021-09-30 ENCOUNTER — Encounter: Payer: Self-pay | Admitting: Dermatology

## 2021-12-27 ENCOUNTER — Other Ambulatory Visit: Payer: Self-pay | Admitting: Otolaryngology

## 2021-12-27 DIAGNOSIS — H9313 Tinnitus, bilateral: Secondary | ICD-10-CM

## 2021-12-29 ENCOUNTER — Ambulatory Visit (INDEPENDENT_AMBULATORY_CARE_PROVIDER_SITE_OTHER): Payer: BC Managed Care – PPO | Admitting: Dermatology

## 2021-12-29 DIAGNOSIS — L739 Follicular disorder, unspecified: Secondary | ICD-10-CM

## 2021-12-29 MED ORDER — DOXYCYCLINE MONOHYDRATE 100 MG PO CAPS: 100.0000 mg | ORAL_CAPSULE | Freq: Two times a day (BID) | ORAL | 0 refills | Status: AC

## 2021-12-29 MED ORDER — MUPIROCIN 2 % EX OINT: 1.0000 | TOPICAL_OINTMENT | Freq: Two times a day (BID) | CUTANEOUS | 0 refills | Status: DC

## 2021-12-29 NOTE — Patient Instructions (Addendum)
Recommend taking Heliocare sun protection supplement daily in sunny weather for additional sun protection. For maximum protection on the sunniest days, you can take up to 2 capsules of regular Heliocare OR take 1 capsule of Heliocare Ultra. For prolonged exposure (such as a full day in the sun), you can repeat your dose of the supplement 4 hours after your first dose. Heliocare can be purchased at [employer], at some [facility] or at [email or web address].   Doxycycline should be taken with food to prevent nausea. Do not lay down for 30 minutes after taking. Be cautious with sun exposure and use good sun protection while on this medication. Pregnant women should not take this medication.     Due to recent changes in healthcare laws, you may see results of your pathology and/or laboratory studies on MyChart before the doctors have had a chance to review them. We understand that in some cases there may be results that are confusing or concerning to you. Please understand that not all results are received at the same time and often the doctors may need to interpret multiple results in order to provide you with the best plan of care or course of treatment. Therefore, we ask that you please give [place] 2 business days to thoroughly review all your results before contacting the office for clarification. Should we see a critical lab result, you will be contacted sooner.   If You Need Anything After Your Visit  If you have any questions or concerns for your doctor, please call our main line at [phone] and press option 4 to reach your doctor's medical assistant. If no one answers, please leave a voicemail as directed and we will return your call as soon as possible. Messages left after 4 pm will be answered the following business day.   You may also send [place] a message via [place]. We typically respond to MyChart messages within 1-2 business days.  For prescription refills, please ask your pharmacy to  contact our office. Our fax number is [phone].  If you have an urgent issue when the clinic is closed that cannot wait until the next business day, you can page your doctor at the number below.    Please note that while we do our best to be available for urgent issues outside of office hours, we are not available 24/7.   If you have an urgent issue and are unable to reach [place], you may choose to seek medical care at your doctor's office, retail clinic, urgent care center, or emergency room.  If you have a medical emergency, please immediately call 911 or go to the emergency department.  Pager Numbers  - Dr. Nehemiah Massed: [phone]  - Dr. Laurence Ferrari: [phone]  - Dr. Nicole Kindred: [phone]  In the event of inclement weather, please call our main line at [phone] for an update on the status of any delays or closures.  Dermatology Medication Tips: Please keep the boxes that topical medications come in in order to help keep track of the instructions about where and how to use these. Pharmacies typically print the medication instructions only on the boxes and not directly on the medication tubes.   If your medication is too expensive, please contact our office at [phone] option 4 or send [place] a message through [place].   We are unable to tell what your co-pay for medications will be in advance as this is different depending on your insurance coverage. However, we may be able to find a substitute medication  at lower cost or fill out paperwork to get insurance to cover a needed medication.   If a prior authorization is required to get your medication covered by your insurance company, please allow [place] 1-2 business days to complete this process.  Drug prices often vary depending on where the prescription is filled and some pharmacies may offer cheaper prices.  The website www.goodrx.com contains coupons for medications through different pharmacies. The prices here do not account for what  the cost may be with help from insurance (it may be cheaper with your insurance), but the website can give you the price if you did not use any insurance.  - You can print the associated coupon and take it with your prescription to the pharmacy.  - You may also stop by our office during regular business hours and pick up a GoodRx coupon card.  - If you need your prescription sent electronically to a different pharmacy, notify our office through [facility] or by phone at [phone] option 4.     Si Usted Necesita Algo Despus de Su Visita  Tambin puede enviarnos un mensaje a travs de Pharmacist, community. Por lo general respondemos a los mensajes de MyChart en el transcurso de 1 a 2 das hbiles.  Para renovar recetas, por favor pida a su farmacia que se ponga en contacto con nuestra oficina. Harland Dingwall de fax es [place] [phone].  Si tiene un asunto urgente cuando la clnica est cerrada y que no puede esperar hasta el siguiente da hbil, puede llamar/localizar a su doctor(a) al nmero que aparece a continuacin.   Por favor, tenga en cuenta que aunque hacemos todo lo posible para estar disponibles para asuntos urgentes fuera del horario de [place], no estamos disponibles las 24 horas del da, los 7 das de la [place].   Si tiene un problema urgente y no puede comunicarse con nosotros, puede optar por buscar atencin mdica  en el consultorio de su doctor(a), en una clnica privada, en un centro de atencin urgente o en una sala de emergencias.  Si tiene Engineering geologist, por favor llame inmediatamente al 911 o vaya a la sala de emergencias.  Nmeros de bper  - Dr. Nehemiah Massed: [phone]  - Dra. Moye: [phone]  - Dra. Nicole Kindred: [phone]  En caso de inclemencias del [place], por favor llame a Johnsie Kindred principal al [phone] para una actualizacin sobre el [place] de cualquier retraso o cierre.  Consejos para la medicacin en dermatologa: Por favor, guarde las  cajas en las que vienen los medicamentos de uso tpico para ayudarle a seguir las instrucciones sobre dnde y cmo usarlos. Las farmacias generalmente imprimen las instrucciones del medicamento slo en las cajas y no directamente en los tubos del [place].   Si su medicamento es muy caro, por favor, pngase en contacto con Zigmund Daniel llamando al [phone] y presione la opcin 4 o envenos un mensaje a travs de Pharmacist, community.   No podemos decirle cul ser su copago por los medicamentos por adelantado ya que esto es diferente dependiendo de la cobertura de su seguro. Sin embargo, es posible que podamos encontrar un medicamento sustituto a Electrical engineer un formulario para que el seguro cubra el medicamento que se considera necesario.   Si se requiere una autorizacin previa para que su compaa de seguros [place] su medicamento, por favor permtanos de 1 a 2 das hbiles para completar este proceso.  Los precios de los medicamentos varan con frecuencia dependiendo del Environmental consultant de dnde  se surte la receta y alguna farmacias pueden ofrecer precios ms baratos.  El sitio web www.goodrx.com tiene cupones para medicamentos de Airline pilot. Los precios aqu no tienen en cuenta lo que podra costar con la ayuda del seguro (puede ser ms barato con su seguro), pero el sitio web puede darle el precio si no utiliz Research scientist (physical sciences).  - Puede imprimir el cupn correspondiente y llevarlo con su receta a la farmacia.  - Tambin puede pasar por nuestra oficina durante el horario de atencin regular y Charity fundraiser una tarjeta de cupones de GoodRx.  - Si necesita que su receta se enve electrnicamente a una farmacia diferente, informe a nuestra oficina a travs de MyChart de [place] o por telfono llamando al [phone] y presione la opcin 4.

## 2021-12-29 NOTE — Progress Notes (Signed)
   Follow-Up Visit   Subjective  Heidi Watson is a 56 y.o. female who presents for the following: Cyst (Patient here today for a bump at left groin. It was inflamed but has gotten better. Present for about 2 months.).   The following portions of the chart were reviewed this encounter and updated as appropriate:   Tobacco  Allergies  Meds  Problems  Med Hx  Surg Hx  Fam Hx      Review of Systems:  No other skin or systemic complaints except as noted in HPI or Assessment and Plan.  Objective  Well appearing patient in no apparent distress; mood and affect are within normal limits.  A focused examination was performed including groin. Relevant physical exam findings are noted in the Assessment and Plan.  left inguinal fold Follicular-based erythematous papule    Assessment & Plan  Folliculitis left inguinal fold  Can start Doxy 100 bid x 7 days IF NEEDED. Defer at this time due to upcoming trip to [place].  Start heliocare for sun protection while in [facility] mupirocin TID until spot clear  Doxycycline should be taken with food to prevent nausea. Do not lay down for 30 minutes after taking. Be cautious with sun exposure and use good sun protection while on this medication. Pregnant women should not take this medication.      mupirocin ointment (BACTROBAN) 2 % - left inguinal fold Apply 1 Application topically 2 (two) times daily.  doxycycline (MONODOX) 100 MG capsule - left inguinal fold Take 1 capsule (100 mg total) by mouth 2 (two) times daily for 7 days. Take with food   Return for as scheduled.  Graciella Belton, RMA, am acting as scribe for Forest Gleason, MD .  Documentation: I have reviewed the above documentation for accuracy and completeness, and I agree with the above.  Forest Gleason, MD

## 2022-01-01 ENCOUNTER — Encounter: Payer: Self-pay | Admitting: Dermatology

## 2022-01-18 ENCOUNTER — Ambulatory Visit
Admission: RE | Admit: 2022-01-18 | Discharge: 2022-01-18 | Disposition: A | Discharge status: Home / Self Care | Payer: BC Managed Care – PPO | Source: Ambulatory Visit | Attending: Otolaryngology | Admitting: Otolaryngology

## 2022-01-18 DIAGNOSIS — H9313 Tinnitus, bilateral: Secondary | ICD-10-CM

## 2022-01-18 MED ORDER — GADOBENATE DIMEGLUMINE 529 MG/ML IV SOLN
15.0000 mL | Freq: Once | INTRAVENOUS | Status: AC | PRN
  Administered 2022-01-18: 15 mL via INTRAVENOUS

## 2022-03-31 ENCOUNTER — Ambulatory Visit (INDEPENDENT_AMBULATORY_CARE_PROVIDER_SITE_OTHER): Payer: BC Managed Care – PPO | Admitting: Dermatology

## 2022-03-31 VITALS — BP 152/89 | HR 64

## 2022-03-31 DIAGNOSIS — Z86018 Personal history of other benign neoplasm: Secondary | ICD-10-CM

## 2022-03-31 DIAGNOSIS — L718 Other rosacea: Secondary | ICD-10-CM

## 2022-03-31 DIAGNOSIS — Z7189 Other specified counseling: Secondary | ICD-10-CM

## 2022-03-31 DIAGNOSIS — Z85820 Personal history of malignant melanoma of skin: Secondary | ICD-10-CM

## 2022-03-31 DIAGNOSIS — L578 Other skin changes due to chronic exposure to nonionizing radiation: Secondary | ICD-10-CM

## 2022-03-31 DIAGNOSIS — D229 Melanocytic nevi, unspecified: Secondary | ICD-10-CM

## 2022-03-31 DIAGNOSIS — L821 Other seborrheic keratosis: Secondary | ICD-10-CM

## 2022-03-31 DIAGNOSIS — L814 Other melanin hyperpigmentation: Secondary | ICD-10-CM

## 2022-03-31 DIAGNOSIS — Z86006 Personal history of melanoma in-situ: Secondary | ICD-10-CM

## 2022-03-31 DIAGNOSIS — L603 Nail dystrophy: Secondary | ICD-10-CM

## 2022-03-31 DIAGNOSIS — Z1283 Encounter for screening for malignant neoplasm of skin: Secondary | ICD-10-CM

## 2022-03-31 DIAGNOSIS — L719 Rosacea, unspecified: Secondary | ICD-10-CM

## 2022-03-31 NOTE — Progress Notes (Signed)
Follow-Up Visit   Subjective  Heidi Watson is a 56 y.o. female who presents for the following: Annual Exam (6 month tbse. Hx of rosacea, hs of isk, hx of psoriasis, hx of dysplastic nevus./Dry spot on right side of face and toenails she would like checked. Patient reports hiking for 12 miles and noticed discoloration. ). The patient presents for Upper Body Skin Exam (UBSE) for skin cancer screening and mole check.  The patient has spots, moles and lesions to be evaluated, some may be new or changing and the patient has concerns that these could be cancer.  The following portions of the chart were reviewed this encounter and updated as appropriate:  Tobacco  Allergies  Meds  Problems  Med Hx  Surg Hx  Fam Hx     Review of Systems: No other skin or systemic complaints except as noted in HPI or Assessment and Plan.  Objective  Well appearing patient in no apparent distress; mood and affect are within normal limits.  A full examination was performed including scalp, head, eyes, ears, nose, lips, neck, chest, axillae, abdomen, back, buttocks, bilateral upper extremities, bilateral lower extremities, hands, feet, fingers, toes, fingernails, and toenails. All findings within normal limits unless otherwise noted below.  face Some pinkness at face and nose  b/l toenails Nail dystrophy with subunguial hematoma   Assessment & Plan  Rosacea Erythematotelangiectatic rosacea face Rosacea is a chronic progressive skin condition usually affecting the face of adults, causing redness and/or acne bumps. It is treatable but not curable. It sometimes affects the eyes (ocular rosacea) as well. It may respond to topical and/or systemic medication and can flare with stress, sun exposure, alcohol, exercise, topical steroids (including hydrocortisone/cortisone 10) and some foods.  Daily application of broad spectrum spf 30+ sunscreen to face is recommended to reduce flares. Counseling for BBL / IPL /  Laser and Coordination of Care Discussed the treatment option of Broad Band Light (BBL) Intense Pulsed Light (IPL) / Laser.  Typically we recommend at least 1-3 treatment sessions about 5-8 weeks apart for best results.  The patient's condition may require "maintenance treatments" in the future.  The fee for BBL / laser treatments is $350 per treatment session for the whole face.  A fee can be quoted for other parts of the body. Insurance typically does not pay for BBL/laser treatments and therefore the fee is an out-of-pocket cost.  Nail dystrophy b/l toenails Probable caused by trauma after hiking Discussed can take some time to heal  Benign appearing. Observe   Lentigines - Scattered tan macules - Due to sun exposure - Benign-appearing, observe - Recommend daily broad spectrum sunscreen SPF 30+ to sun-exposed areas, reapply every 2 hours as needed. - Call for any changes  Seborrheic Keratoses At right cheek and right temple  - Stuck-on, waxy, tan-brown papules and/or plaques  - Benign-appearing - Discussed benign etiology and prognosis. - Observe - Call for any changes  Melanocytic Nevi - Tan-brown and/or pink-flesh-colored symmetric macules and papules - Benign appearing on exam today - Observation - Call clinic for new or changing moles - Recommend daily use of broad spectrum spf 30+ sunscreen to sun-exposed areas.   Hemangiomas - Red papules - Discussed benign nature - Observe - Call for any changes  Actinic Damage - Chronic condition, secondary to cumulative UV/sun exposure - diffuse scaly erythematous macules with underlying dyspigmentation - Recommend daily broad spectrum sunscreen SPF 30+ to sun-exposed areas, reapply every 2 hours as needed.  -  Staying in the shade or wearing long sleeves, sun glasses (UVA+UVB protection) and wide brim hats (4-inch brim around the entire circumference of the hat) are also recommended for sun protection.  - Call for new or  changing lesions.  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  - Call if any new or changing lesions are noted between office visits  History of Melanoma in Situ Left tricep distal 2019 - No evidence of recurrence today.  No lymphadenopathy. - Recommend regular full body skin exams - Recommend daily broad spectrum sunscreen SPF 30+ to sun-exposed areas, reapply every 2 hours as needed.  - Call if any new or changing lesions are noted between office visits  History of Dysplastic Nevi Multiple locations see history  - No evidence of recurrence today - Recommend regular full body skin exams - Recommend daily broad spectrum sunscreen SPF 30+ to sun-exposed areas, reapply every 2 hours as needed.  - Call if any new or changing lesions are noted between office visits  Skin cancer screening performed today. Return in about 6 months (around 09/30/2022) for TBSE. IRuthell Rummage, CMA, am acting as scribe for Sarina Ser, MD. Documentation: I have reviewed the above documentation for accuracy and completeness, and I agree with the above.  Sarina Ser, MD

## 2022-03-31 NOTE — Patient Instructions (Addendum)
     Melanoma ABCDEs  Melanoma is the most dangerous type of skin cancer, and is the leading cause of death from skin disease.  You are more likely to develop melanoma if you: Have light-colored skin, light-colored eyes, or red or blond hair Spend a lot of time in the sun Tan regularly, either outdoors or in a tanning bed Have had blistering sunburns, especially during childhood Have a close family member who has had a melanoma Have atypical moles or large birthmarks  Early detection of melanoma is key since treatment is typically straightforward and cure rates are extremely high if we catch it early.   The first sign of melanoma is often a change in a mole or a new dark spot.  The ABCDE system is a way of remembering the signs of melanoma.  A for asymmetry:  The two halves do not match. B for border:  The edges of the growth are irregular. C for color:  A mixture of colors are present instead of an even brown color. D for diameter:  Melanomas are usually (but not always) greater than 6mm - the size of a pencil eraser. E for evolution:  The spot keeps changing in size, shape, and color.  Please check your skin once per month between visits. You can use a small mirror in front and a large mirror behind you to keep an eye on the back side or your body.   If you see any new or changing lesions before your next follow-up, please call to schedule a visit.  Please continue daily skin protection including broad spectrum sunscreen SPF 30+ to sun-exposed areas, reapplying every 2 hours as needed when you're outdoors.   Staying in the shade or wearing long sleeves, sun glasses (UVA+UVB protection) and wide brim hats (4-inch brim around the entire circumference of the hat) are also recommended for sun protection.    Due to recent changes in healthcare laws, you may see results of your pathology and/or laboratory studies on MyChart before the doctors have had a chance to review them. We  understand that in some cases there may be results that are confusing or concerning to you. Please understand that not all results are received at the same time and often the doctors may need to interpret multiple results in order to provide you with the best plan of care or course of treatment. Therefore, we ask that you please give us 2 business days to thoroughly review all your results before contacting the office for clarification. Should we see a critical lab result, you will be contacted sooner.   If You Need Anything After Your Visit  If you have any questions or concerns for your doctor, please call our main line at [phone] and press option 4 to reach your doctor's medical assistant. If no one answers, please leave a voicemail as directed and we will return your call as soon as possible. Messages left after 4 pm will be answered the following business day.   You may also send us a message via MyChart. We typically respond to MyChart messages within 1-2 business days.  For prescription refills, please ask your pharmacy to contact our office. Our fax number is [phone].  If you have an urgent issue when the clinic is closed that cannot wait until the next business day, you can page your doctor at the number below.    Please note that while we do our best to be available for urgent issues   outside of office hours, we are not available 24/7.   If you have an urgent issue and are unable to reach us, you may choose to seek medical care at your doctor's office, retail clinic, urgent care center, or emergency room.  If you have a medical emergency, please immediately call 911 or go to the emergency department.  Pager Numbers  - Dr. Kowalski: [phone]  - Dr. Moye: [phone]  - Dr. Stewart: [phone]  In the event of inclement weather, please call our main line at [phone] for an update on the status of any delays or closures.  Dermatology Medication Tips: Please  keep the boxes that topical medications come in in order to help keep track of the instructions about where and how to use these. Pharmacies typically print the medication instructions only on the boxes and not directly on the medication tubes.   If your medication is too expensive, please contact our office at [phone] option 4 or send us a message through MyChart.   We are unable to tell what your co-pay for medications will be in advance as this is different depending on your insurance coverage. However, we may be able to find a substitute medication at lower cost or fill out paperwork to get insurance to cover a needed medication.   If a prior authorization is required to get your medication covered by your insurance company, please allow us 1-2 business days to complete this process.  Drug prices often vary depending on where the prescription is filled and some pharmacies may offer cheaper prices.  The website www.goodrx.com contains coupons for medications through different pharmacies. The prices here do not account for what the cost may be with help from insurance (it may be cheaper with your insurance), but the website can give you the price if you did not use any insurance.  - You can print the associated coupon and take it with your prescription to the pharmacy.  - You may also stop by our office during regular business hours and pick up a GoodRx coupon card.  - If you need your prescription sent electronically to a different pharmacy, notify our office through [facility] MyChart or by phone at [phone] option 4.     Si Usted Necesita Algo Despus de Su Visita  Tambin puede enviarnos un mensaje a travs de MyChart. Por lo general respondemos a los mensajes de MyChart en el transcurso de 1 a 2 das hbiles.  Para renovar recetas, por favor pida a su farmacia que se ponga en contacto con nuestra oficina. Nuestro nmero de fax es el [phone].  Si tiene un asunto urgente  cuando la clnica est cerrada y que no puede esperar hasta el siguiente da hbil, puede llamar/localizar a su doctor(a) al nmero que aparece a continuacin.   Por favor, tenga en cuenta que aunque hacemos todo lo posible para estar disponibles para asuntos urgentes fuera del horario de oficina, no estamos disponibles las 24 horas del da, los 7 das de la semana.   Si tiene un problema urgente y no puede comunicarse con nosotros, puede optar por buscar atencin mdica  en el consultorio de su doctor(a), en una clnica privada, en un centro de atencin urgente o en una sala de emergencias.  Si tiene una emergencia mdica, por favor llame inmediatamente al 911 o vaya a la sala de emergencias.  Nmeros de bper  - Dr. Kowalski: [phone]  - Dra. Moye: [phone]  - Dra. Stewart: [phone]  En caso   de inclemencias del tiempo, por favor llame a nuestra lnea principal al [phone] para una actualizacin sobre el estado de cualquier retraso o cierre.  Consejos para la medicacin en dermatologa: Por favor, guarde las cajas en las que vienen los medicamentos de uso tpico para ayudarle a seguir las instrucciones sobre dnde y cmo usarlos. Las farmacias generalmente imprimen las instrucciones del medicamento slo en las cajas y no directamente en los tubos del medicamento.   Si su medicamento es muy caro, por favor, pngase en contacto con nuestra oficina llamando al [phone] y presione la opcin 4 o envenos un mensaje a travs de MyChart.   No podemos decirle cul ser su copago por los medicamentos por adelantado ya que esto es diferente dependiendo de la cobertura de su seguro. Sin embargo, es posible que podamos encontrar un medicamento sustituto a menor costo o llenar un formulario para que el seguro cubra el medicamento que se considera necesario.   Si se requiere una autorizacin previa para que su compaa de seguros cubra su medicamento, por favor permtanos de 1 a 2  das hbiles para completar este proceso.  Los precios de los medicamentos varan con frecuencia dependiendo del lugar de dnde se surte la receta y alguna farmacias pueden ofrecer precios ms baratos.  El sitio web www.goodrx.com tiene cupones para medicamentos de diferentes farmacias. Los precios aqu no tienen en cuenta lo que podra costar con la ayuda del seguro (puede ser ms barato con su seguro), pero el sitio web puede darle el precio si no utiliz ningn seguro.  - Puede imprimir el cupn correspondiente y llevarlo con su receta a la farmacia.  - Tambin puede pasar por nuestra oficina durante el horario de atencin regular y recoger una tarjeta de cupones de GoodRx.  - Si necesita que su receta se enve electrnicamente a una farmacia diferente, informe a nuestra oficina a travs de MyChart de [place] o por telfono llamando al [phone] y presione la opcin 4.  

## 2022-04-09 ENCOUNTER — Encounter: Payer: Self-pay | Admitting: Dermatology

## 2022-06-20 ENCOUNTER — Other Ambulatory Visit: Payer: Self-pay | Admitting: Dermatology

## 2022-06-20 DIAGNOSIS — L409 Psoriasis, unspecified: Secondary | ICD-10-CM

## 2022-07-21 ENCOUNTER — Ambulatory Visit (INDEPENDENT_AMBULATORY_CARE_PROVIDER_SITE_OTHER): Payer: BC Managed Care – PPO | Admitting: Dermatology

## 2022-07-21 ENCOUNTER — Encounter: Payer: Self-pay | Admitting: Dermatology

## 2022-07-21 VITALS — BP 157/93 | HR 73

## 2022-07-21 DIAGNOSIS — L578 Other skin changes due to chronic exposure to nonionizing radiation: Secondary | ICD-10-CM

## 2022-07-21 DIAGNOSIS — L820 Inflamed seborrheic keratosis: Secondary | ICD-10-CM

## 2022-07-21 NOTE — Patient Instructions (Addendum)

## 2022-07-21 NOTE — Progress Notes (Signed)
   Follow-Up Visit   Subjective  Heidi Watson is a 57 y.o. female who presents for the following: spot at left forearm that was irritated she would like checked and treated.  The patient has spots, moles and lesions to be evaluated, some may be new or changing and the patient has concerns that these could be cancer.  The following portions of the chart were reviewed this encounter and updated as appropriate: medications, allergies, medical history  Review of Systems:  No other skin or systemic complaints except as noted in HPI or Assessment and Plan.  Objective  Well appearing patient in no apparent distress; mood and affect are within normal limits.   A focused examination was performed of the following areas: Spot left forearm that was told was isk, has photos wouldl ike checked.   Relevant exam findings are noted in the Assessment and Plan.   Assessment & Plan    INFLAMED SEBORRHEIC KERATOSIS Exam: Erythematous keratotic or waxy stuck-on papule or plaque.  Symptomatic, irritating, patient would like treated.  Benign-appearing.  Call clinic for new or changing lesions.   Prior to procedure, discussed risks of blister formation, small wound, skin dyspigmentation, or rare scar following treatment. Recommend Vaseline ointment to treated areas while healing.  Destruction Procedure Note Destruction method: cryotherapy   Informed consent: discussed and consent obtained   Lesion destroyed using liquid nitrogen: Yes   Outcome: patient tolerated procedure well with no complications   Post-procedure details: wound care instructions given   Locations: left forearm x 1 # of Lesions Treated: 1  ACTINIC DAMAGE - chronic, secondary to cumulative UV radiation exposure/sun exposure over time - diffuse scaly erythematous macules with underlying dyspigmentation - Recommend daily broad spectrum sunscreen SPF 30+ to sun-exposed areas, reapply every 2 hours as needed.  - Recommend staying  in the shade or wearing long sleeves, sun glasses (UVA+UVB protection) and wide brim hats (4-inch brim around the entire circumference of the hat). - Call for new or changing lesions.   Return for keep follow up as scheduled .  IAsher Muir, CMA, am acting as scribe for Armida Sans, MD.  Documentation: I have reviewed the above documentation for accuracy and completeness, and I agree with the above.  Armida Sans, MD

## 2022-08-07 ENCOUNTER — Encounter: Payer: Self-pay | Admitting: Dermatology

## 2022-09-29 ENCOUNTER — Ambulatory Visit: Payer: BC Managed Care – PPO | Admitting: Dermatology

## 2022-09-29 ENCOUNTER — Encounter: Payer: Self-pay | Admitting: Dermatology

## 2022-09-29 VITALS — BP 193/97 | HR 59

## 2022-09-29 DIAGNOSIS — Z7189 Other specified counseling: Secondary | ICD-10-CM

## 2022-09-29 DIAGNOSIS — L409 Psoriasis, unspecified: Secondary | ICD-10-CM

## 2022-09-29 DIAGNOSIS — Z79899 Other long term (current) drug therapy: Secondary | ICD-10-CM

## 2022-09-29 DIAGNOSIS — D1801 Hemangioma of skin and subcutaneous tissue: Secondary | ICD-10-CM | POA: Diagnosis not present

## 2022-09-29 DIAGNOSIS — L578 Other skin changes due to chronic exposure to nonionizing radiation: Secondary | ICD-10-CM

## 2022-09-29 DIAGNOSIS — L814 Other melanin hyperpigmentation: Secondary | ICD-10-CM

## 2022-09-29 DIAGNOSIS — L259 Unspecified contact dermatitis, unspecified cause: Secondary | ICD-10-CM

## 2022-09-29 DIAGNOSIS — D229 Melanocytic nevi, unspecified: Secondary | ICD-10-CM

## 2022-09-29 DIAGNOSIS — L821 Other seborrheic keratosis: Secondary | ICD-10-CM

## 2022-09-29 DIAGNOSIS — Z85820 Personal history of malignant melanoma of skin: Secondary | ICD-10-CM

## 2022-09-29 DIAGNOSIS — Z86018 Personal history of other benign neoplasm: Secondary | ICD-10-CM

## 2022-09-29 DIAGNOSIS — L719 Rosacea, unspecified: Secondary | ICD-10-CM

## 2022-09-29 DIAGNOSIS — Z86006 Personal history of melanoma in-situ: Secondary | ICD-10-CM

## 2022-09-29 DIAGNOSIS — W908XXA Exposure to other nonionizing radiation, initial encounter: Secondary | ICD-10-CM

## 2022-09-29 DIAGNOSIS — Z1283 Encounter for screening for malignant neoplasm of skin: Secondary | ICD-10-CM

## 2022-09-29 MED ORDER — MOMETASONE FUROATE 0.1 % EX OINT: TOPICAL_OINTMENT | CUTANEOUS | 1 refills | Status: AC

## 2022-09-29 NOTE — Progress Notes (Signed)
Follow-Up Visit   Subjective  Heidi Watson is a 57 y.o. female who presents for the following: Skin Cancer Screening and Full Body Skin Exam. Hx MM, HxMIS, HxDN Check spot on lip.   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 Has had discoloration on feet and thighs since then. Bright red in water.   The patient presents for Total-Body Skin Exam (TBSE) for skin cancer screening and mole check. The patient has spots, moles and lesions to be evaluated, some may be new or changing and the patient has concerns that these could be cancer.    The following portions of the chart were reviewed this encounter and updated as appropriate: medications, allergies, medical history  Review of Systems:  No other skin or systemic complaints except as noted in HPI or Assessment and Plan.  Objective  Well appearing patient in no apparent distress; mood and affect are within normal limits.  A full examination was performed including scalp, head, eyes, ears, nose, lips, neck, chest, axillae, abdomen, back, buttocks, bilateral upper extremities, bilateral lower extremities, hands, feet, fingers, toes, fingernails, and toenails. All findings within normal limits unless otherwise noted below.   Relevant physical exam findings are noted in the Assessment and Plan.    Assessment & Plan   HISTORY OF MELANOMA. Right post prox lat thigh - Breslow's 0.17mm, Clark Level II. 10/11/2016 - No evidence of recurrence today - No lymphadenopathy - Recommend regular full body skin exams - Recommend daily broad spectrum sunscreen SPF 30+ to sun-exposed areas, reapply every 2 hours as needed.  - Call if any new or changing lesions are noted between office visits   HISTORY OF MELANOMA IN SITU. Left distal tricep. 09/26/2017.  - No evidence of recurrence today - No lymphadenopathy - Recommend regular full body skin exams - Recommend daily broad spectrum sunscreen SPF 30+ to sun-exposed areas, reapply every 2 hours as needed.   - Call if any new or changing lesions are noted between office visits   HISTORY OF DYSPLASTIC NEVI. Multiple sites, see history.  No evidence of recurrence today Recommend regular full body skin exams Recommend daily broad spectrum sunscreen SPF 30+ to sun-exposed areas, reapply every 2 hours as needed.  Call if any new or changing lesions are noted between office visits   LENTIGINES, SEBORRHEIC KERATOSES, HEMANGIOMAS - Benign normal skin lesions - Benign-appearing - Call for any changes  MELANOCYTIC NEVI - Tan-brown and/or pink-flesh-colored symmetric macules and papules - Benign appearing on exam today - Observation - Call clinic for new or changing moles - Recommend daily use of broad spectrum spf 30+ sunscreen to sun-exposed areas.   ACTINIC DAMAGE - Chronic condition, secondary to cumulative UV/sun exposure - diffuse scaly erythematous macules with underlying dyspigmentation - Recommend daily broad spectrum sunscreen SPF 30+ to sun-exposed areas, reapply every 2 hours as needed.  - Staying in the shade or wearing long sleeves, sun glasses (UVA+UVB protection) and wide brim hats (4-inch brim around the entire circumference of the hat) are also recommended for sun protection.  - Call for new or changing lesions.  SKIN CANCER SCREENING PERFORMED TODAY.  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 Condition is bothersome/symptomatic for patient. Currently flared.   Rosacea is a chronic progressive skin condition usually affecting the face of adults, causing redness and/or acne bumps. It is treatable but not curable. It sometimes affects the eyes (ocular rosacea) as well. It  may respond to topical and/or systemic medication and can flare with stress, sun exposure, alcohol, exercise, topical steroids (including hydrocortisone/cortisone 10) and some foods.  Daily application of broad  spectrum spf 30+ sunscreen to face is recommended to reduce flares.  Patient denies grittiness of the eyes  Treatment Plan Patient deferred treatment at this time.    CONTACT DERMATITIS with subsequent exacerbation of psoriasis Exam: Scaly pink papules and/or plaques  Treatment Plan: Start Mometasone ointment twice daily 5 days a week.   Topical steroids (such as triamcinolone, fluocinolone, fluocinonide, mometasone, clobetasol, halobetasol, betamethasone, hydrocortisone) can cause thinning and lightening of the skin if they are used for too long in the same area. Your physician has selected the right strength medicine for your problem and area affected on the body. Please use your medication only as directed by your physician to prevent side effects.      Return in about 6 months (around 03/31/2023) for TBSE, HxMM, HxMIS, HxDN.  I, Lawson Radar, CMA, am acting as scribe for Armida Sans, MD.   Documentation: I have reviewed the above documentation for accuracy and completeness, and I agree with the above.  Armida Sans, MD

## 2022-09-29 NOTE — Patient Instructions (Addendum)
Cryotherapy Aftercare  Wash gently with soap and water everyday.   Apply Vaseline Jelly daily until healed.    Start Mometasone ointment twice daily 5 days a week to affected areas on feet and legs as needed.   Topical steroids (such as triamcinolone, fluocinolone, fluocinonide, mometasone, clobetasol, halobetasol, betamethasone, hydrocortisone) can cause thinning and lightening of the skin if they are used for too long in the same area. Your physician has selected the right strength medicine for your problem and area affected on the body. Please use your medication only as directed by your physician to prevent side effects.    Recommend daily broad spectrum sunscreen SPF 30+ to sun-exposed areas, reapply every 2 hours as needed. Call for new or changing lesions.  Staying in the shade or wearing long sleeves, sun glasses (UVA+UVB protection) and wide brim hats (4-inch brim around the entire circumference of the hat) are also recommended for sun protection.    Melanoma ABCDEs  Melanoma is the most dangerous type of skin cancer, and is the leading cause of death from skin disease.  You are more likely to develop melanoma if you: Have light-colored skin, light-colored eyes, or red or blond hair Spend a lot of time in the sun Tan regularly, either outdoors or in a tanning bed Have had blistering sunburns, especially during childhood Have a close family member who has had a melanoma Have atypical moles or large birthmarks  Early detection of melanoma is key since treatment is typically straightforward and cure rates are extremely high if we catch it early.   The first sign of melanoma is often a change in a mole or a new dark spot.  The ABCDE system is a way of remembering the signs of melanoma.  A for asymmetry:  The two halves do not match. B for border:  The edges of the growth are irregular. C for color:  A mixture of colors are present instead of an even brown color. D for diameter:   Melanomas are usually (but not always) greater than 6mm - the size of a pencil eraser. E for evolution:  The spot keeps changing in size, shape, and color.  Please check your skin once per month between visits. You can use a small mirror in front and a large mirror behind you to keep an eye on the back side or your body.   If you see any new or changing lesions before your next follow-up, please call to schedule a visit.  Please continue daily skin protection including broad spectrum sunscreen SPF 30+ to sun-exposed areas, reapplying every 2 hours as needed when you're outdoors.   Staying in the shade or wearing long sleeves, sun glasses (UVA+UVB protection) and wide brim hats (4-inch brim around the entire circumference of the hat) are also recommended for sun protection.    Due to recent changes in healthcare laws, you may see results of your pathology and/or laboratory studies on MyChart before the doctors have had a chance to review them. We understand that in some cases there may be results that are confusing or concerning to you. Please understand that not all results are received at the same time and often the doctors may need to interpret multiple results in order to provide you with the best plan of care or course of treatment. Therefore, we ask that you please give [place] 2 business days to thoroughly review all your results before contacting the office for clarification. Should we see a critical lab result,  you will be contacted sooner.   If You Need Anything After Your Visit  If you have any questions or concerns for your doctor, please call our main line at [phone] and press option 4 to reach your doctor's medical assistant. If no one answers, please leave a voicemail as directed and we will return your call as soon as possible. Messages left after 4 pm will be answered the following business day.   You may also send [place] a message via MyChart. We typically respond to MyChart messages  within 1-2 business days.  For prescription refills, please ask your pharmacy to contact our office. Our fax number is [phone].  If you have an urgent issue when the clinic is closed that cannot wait until the next business day, you can page your doctor at the number below.    Please note that while we do our best to be available for urgent issues outside of office hours, we are not available 24/7.   If you have an urgent issue and are unable to reach [place], you may choose to seek medical care at your doctor's office, retail clinic, urgent care center, or emergency room.  If you have a medical emergency, please immediately call 911 or go to the emergency department.  Pager Numbers  - Dr. Gwen Pounds: [phone]  - Dr. Neale Burly: [phone]  - Dr. Roseanne Reno: [phone]  In the event of inclement weather, please call our main line at [phone] for an update on the status of any delays or closures.  Dermatology Medication Tips: Please keep the boxes that topical medications come in in order to help keep track of the instructions about where and how to use these. Pharmacies typically print the medication instructions only on the boxes and not directly on the medication tubes.   If your medication is too expensive, please contact our office at [phone] option 4 or send [place] a message through MyChart.   We are unable to tell what your co-pay for medications will be in advance as this is different depending on your insurance coverage. However, we may be able to find a substitute medication at lower cost or fill out paperwork to get insurance to cover a needed medication.   If a prior authorization is required to get your medication covered by your insurance company, please allow [place] 1-2 business days to complete this process.  Drug prices often vary depending on where the prescription is filled and some pharmacies may offer cheaper prices.  The website www.goodrx.com contains coupons for  medications through different pharmacies. The prices here do not account for what the cost may be with help from insurance (it may be cheaper with your insurance), but the website can give you the price if you did not use any insurance.  - You can print the associated coupon and take it with your prescription to the pharmacy.  - You may also stop by our office during regular business hours and pick up a GoodRx coupon card.  - If you need your prescription sent electronically to a different pharmacy, notify our office through [facility] or by phone at [phone] option 4.     Si Usted Necesita Algo Despus de Su Visita  Tambin puede enviarnos un mensaje a travs de Clinical cytogeneticist. Por lo general respondemos a los mensajes de MyChart en el transcurso de 1 a 2 das hbiles.  Para renovar recetas, por favor pida a su farmacia que se ponga en contacto con nuestra oficina. [place] nmero  de fax es el [phone].  Si tiene un asunto urgente cuando la clnica est cerrada y que no puede esperar hasta el siguiente da hbil, puede llamar/localizar a su doctor(a) al nmero que aparece a continuacin.   Por favor, tenga en cuenta que aunque hacemos todo lo posible para estar disponibles para asuntos urgentes fuera del horario de [place], no estamos disponibles las 24 horas del da, los 7 [address]  [address] de la [place].   Si tiene un problema urgente y no puede comunicarse con nosotros, puede optar por buscar atencin mdica  en el consultorio de su doctor(a), en una clnica privada, en un centro de atencin urgente o en una sala de emergencias.  Si tiene Engineer, drilling, por favor llame inmediatamente al 911 o vaya a la sala de emergencias.  Nmeros de bper  - Dr. Gwen Pounds: [phone]  - Dra. Moye: [phone]  - Dra. Roseanne Reno: [phone]  En caso de inclemencias del [place], por favor llame a Lacy Duverney principal al [phone] para una actualizacin sobre el [place] de cualquier retraso o  cierre.  Consejos para la medicacin en dermatologa: Por favor, guarde las cajas en las que vienen los medicamentos de uso tpico para ayudarle a seguir las instrucciones sobre dnde y cmo usarlos. Las farmacias generalmente imprimen las instrucciones del medicamento slo en las cajas y no directamente en los tubos del [place].   Si su medicamento es muy caro, por favor, pngase en contacto con Rolm Gala llamando al [phone] y presione la opcin 4 o envenos un mensaje a travs de Clinical cytogeneticist.   No podemos decirle cul ser su copago por los medicamentos por adelantado ya que esto es diferente dependiendo de la cobertura de su seguro. Sin embargo, es posible que podamos encontrar un medicamento sustituto a Audiological scientist un formulario para que el seguro cubra el medicamento que se considera necesario.   Si se requiere una autorizacin previa para que su compaa de seguros [place] su medicamento, por favor permtanos de 1 a 2 das hbiles para completar [address]  Los precios de los medicamentos varan con frecuencia dependiendo del Environmental consultant de dnde se surte la receta y alguna farmacias pueden ofrecer precios ms baratos.  El sitio web www.goodrx.com tiene cupones para medicamentos de Health and safety inspector. Los precios aqu no tienen en cuenta lo que podra costar con la ayuda del seguro (puede ser ms barato con su seguro), pero el sitio web puede darle el precio si no utiliz Tourist information centre manager.  - Puede imprimir el cupn correspondiente y llevarlo con su receta a la farmacia.  - Tambin puede pasar por nuestra oficina durante el horario de atencin regular y Education officer, museum una tarjeta de cupones de GoodRx.  - Si necesita que su receta se enve electrnicamente a una farmacia diferente, informe a nuestra oficina a travs de MyChart de [place] o por telfono llamando al [phone] y presione la opcin 4.

## 2022-10-02 ENCOUNTER — Encounter: Payer: Self-pay | Admitting: Dermatology

## 2022-12-06 ENCOUNTER — Encounter: Payer: Self-pay | Admitting: Gastroenterology

## 2022-12-06 ENCOUNTER — Other Ambulatory Visit: Payer: Self-pay | Admitting: Gastroenterology

## 2022-12-06 DIAGNOSIS — R634 Abnormal weight loss: Secondary | ICD-10-CM

## 2022-12-06 DIAGNOSIS — R748 Abnormal levels of other serum enzymes: Secondary | ICD-10-CM

## 2022-12-06 DIAGNOSIS — R1031 Right lower quadrant pain: Secondary | ICD-10-CM

## 2022-12-08 ENCOUNTER — Ambulatory Visit
Admission: RE | Admit: 2022-12-08 | Discharge: 2022-12-08 | Disposition: A | Discharge status: Home / Self Care | Payer: BC Managed Care – PPO | Source: Ambulatory Visit | Attending: Gastroenterology | Admitting: Gastroenterology

## 2022-12-08 DIAGNOSIS — R1031 Right lower quadrant pain: Secondary | ICD-10-CM

## 2022-12-08 DIAGNOSIS — R748 Abnormal levels of other serum enzymes: Secondary | ICD-10-CM

## 2022-12-08 DIAGNOSIS — R634 Abnormal weight loss: Secondary | ICD-10-CM

## 2022-12-08 MED ORDER — IOPAMIDOL (ISOVUE-300) INJECTION 61%
100.0000 mL | Freq: Once | INTRAVENOUS | Status: AC | PRN
  Administered 2022-12-08: 100 mL via INTRAVENOUS

## 2022-12-19 ENCOUNTER — Other Ambulatory Visit: Payer: Self-pay | Admitting: Gastroenterology

## 2022-12-19 DIAGNOSIS — R935 Abnormal findings on diagnostic imaging of other abdominal regions, including retroperitoneum: Secondary | ICD-10-CM

## 2022-12-21 ENCOUNTER — Ambulatory Visit
Admission: RE | Admit: 2022-12-21 | Discharge: 2022-12-21 | Disposition: A | Discharge status: Home / Self Care | Payer: BC Managed Care – PPO | Source: Ambulatory Visit | Attending: Gastroenterology | Admitting: Gastroenterology

## 2022-12-21 DIAGNOSIS — R935 Abnormal findings on diagnostic imaging of other abdominal regions, including retroperitoneum: Secondary | ICD-10-CM | POA: Insufficient documentation

## 2022-12-21 MED ORDER — GADOBUTROL 1 MMOL/ML IV SOLN
7.5000 mL | Freq: Once | INTRAVENOUS | Status: AC | PRN
  Administered 2022-12-21: 7.5 mL via INTRAVENOUS

## 2023-03-29 ENCOUNTER — Ambulatory Visit: Payer: BC Managed Care – PPO | Admitting: Dermatology

## 2023-03-29 ENCOUNTER — Encounter: Payer: Self-pay | Admitting: Dermatology

## 2023-03-29 DIAGNOSIS — L578 Other skin changes due to chronic exposure to nonionizing radiation: Secondary | ICD-10-CM

## 2023-03-29 DIAGNOSIS — Z86018 Personal history of other benign neoplasm: Secondary | ICD-10-CM

## 2023-03-29 DIAGNOSIS — Z86006 Personal history of melanoma in-situ: Secondary | ICD-10-CM

## 2023-03-29 DIAGNOSIS — Z85820 Personal history of malignant melanoma of skin: Secondary | ICD-10-CM

## 2023-03-29 DIAGNOSIS — D1801 Hemangioma of skin and subcutaneous tissue: Secondary | ICD-10-CM

## 2023-03-29 DIAGNOSIS — Z1283 Encounter for screening for malignant neoplasm of skin: Secondary | ICD-10-CM

## 2023-03-29 DIAGNOSIS — L814 Other melanin hyperpigmentation: Secondary | ICD-10-CM

## 2023-03-29 DIAGNOSIS — W908XXA Exposure to other nonionizing radiation, initial encounter: Secondary | ICD-10-CM

## 2023-03-29 DIAGNOSIS — D229 Melanocytic nevi, unspecified: Secondary | ICD-10-CM

## 2023-03-29 DIAGNOSIS — L821 Other seborrheic keratosis: Secondary | ICD-10-CM | POA: Diagnosis not present

## 2023-03-29 DIAGNOSIS — Z7189 Other specified counseling: Secondary | ICD-10-CM

## 2023-03-29 DIAGNOSIS — L853 Xerosis cutis: Secondary | ICD-10-CM

## 2023-03-29 DIAGNOSIS — L719 Rosacea, unspecified: Secondary | ICD-10-CM

## 2023-03-29 NOTE — Patient Instructions (Addendum)
Recommend daily broad spectrum sunscreen SPF 30+ to sun-exposed areas, reapply every 2 hours as needed. Call for new or changing lesions.  Staying in the shade or wearing long sleeves, sun glasses (UVA+UVB protection) and wide brim hats (4-inch brim around the entire circumference of the hat) are also recommended for sun protection.     Counseling for BBL / IPL / Laser and Coordination of Care Discussed the treatment option of Broad Band Light (BBL) /Intense Pulsed Light (IPL)/ Laser for skin discoloration, including brown spots and redness.  Typically we recommend at least 1-3 treatment sessions about 5-8 weeks apart for best results.  Cannot have tanned skin when BBL performed, and regular use of sunscreen/photoprotection is advised after the procedure to help maintain results. The patient's condition may also require "maintenance treatments" in the future.  The fee for BBL / laser treatments is $350 per treatment session for the whole face.  A fee can be quoted for other parts of the body.  Insurance typically does not pay for BBL/laser treatments and therefore the fee is an out-of-pocket cost. Recommend prophylactic valtrex treatment. Once scheduled for procedure, will send Rx in prior to patient's appointment.      Melanoma ABCDEs  Melanoma is the most dangerous type of skin cancer, and is the leading cause of death from skin disease.  You are more likely to develop melanoma if you: Have light-colored skin, light-colored eyes, or red or blond hair Spend a lot of time in the sun Tan regularly, either outdoors or in a tanning bed Have had blistering sunburns, especially during childhood Have a close family member who has had a melanoma Have atypical moles or large birthmarks  Early detection of melanoma is key since treatment is typically straightforward and cure rates are extremely high if we catch it early.   The first sign of melanoma is often a change in a mole or a new dark spot.  The  ABCDE system is a way of remembering the signs of melanoma.  A for asymmetry:  The two halves do not match. B for border:  The edges of the growth are irregular. C for color:  A mixture of colors are present instead of an even brown color. D for diameter:  Melanomas are usually (but not always) greater than 6mm - the size of a pencil eraser. E for evolution:  The spot keeps changing in size, shape, and color.  Please check your skin once per month between visits. You can use a small mirror in front and a large mirror behind you to keep an eye on the back side or your body.   If you see any new or changing lesions before your next follow-up, please call to schedule a visit.  Please continue daily skin protection including broad spectrum sunscreen SPF 30+ to sun-exposed areas, reapplying every 2 hours as needed when you're outdoors.   Staying in the shade or wearing long sleeves, sun glasses (UVA+UVB protection) and wide brim hats (4-inch brim around the entire circumference of the hat) are also recommended for sun protection.      Gentle Skin Care Guide  1. Bathe no more than once a day.  2. Avoid bathing in hot water  3. Use a mild soap like Dove, Vanicream, Cetaphil, CeraVe. Can use Lever 2000 or Cetaphil antibacterial soap  4. Use soap only where you need it. On most days, use it under your arms, between your legs, and on your feet. Let the water rinse  other areas unless visibly dirty.  5. When you get out of the bath/shower, use a towel to gently blot your skin dry, don't rub it.  6. While your skin is still a little damp, apply a moisturizing cream such as Vanicream, CeraVe, Cetaphil, Eucerin, Sarna lotion or plain Vaseline Jelly. For hands apply Neutrogena [place] Hand Cream or Excipial Hand Cream.  7. Reapply moisturizer any time you start to itch or feel dry.  8. Sometimes using free and clear laundry detergents can be helpful. Fabric softener sheets should be avoided.  Downy Free & Gentle liquid, or any liquid fabric softener that is free of dyes and perfumes, it acceptable to use  9. If your doctor has given you prescription creams you may apply moisturizers over them       Due to recent changes in healthcare laws, you may see results of your pathology and/or laboratory studies on MyChart before the doctors have had a chance to review them. We understand that in some cases there may be results that are confusing or concerning to you. Please understand that not all results are received at the same time and often the doctors may need to interpret multiple results in order to provide you with the best plan of care or course of treatment. Therefore, we ask that you please give [place] 2 business days to thoroughly review all your results before contacting the office for clarification. Should we see a critical lab result, you will be contacted sooner.   If You Need Anything After Your Visit  If you have any questions or concerns for your doctor, please call our main line at [phone] and press option 4 to reach your doctor's medical assistant. If no one answers, please leave a voicemail as directed and we will return your call as soon as possible. Messages left after 4 pm will be answered the following business day.   You may also send [place] a message via MyChart. We typically respond to MyChart messages within 1-2 business days.  For prescription refills, please ask your pharmacy to contact our office. Our fax number is [phone].  If you have an urgent issue when the clinic is closed that cannot wait until the next business day, you can page your doctor at the number below.    Please note that while we do our best to be available for urgent issues outside of office hours, we are not available 24/7.   If you have an urgent issue and are unable to reach [place], you may choose to seek medical care at your doctor's office, retail clinic, urgent care center, or emergency  room.  If you have a medical emergency, please immediately call 911 or go to the emergency department.  Pager Numbers  - Dr. Gwen Pounds: [phone]  - Dr. Roseanne Reno: [phone]  - Dr. Katrinka Blazing: [phone]   In the event of inclement weather, please call our main line at [phone] for an update on the status of any delays or closures.  Dermatology Medication Tips: Please keep the boxes that topical medications come in in order to help keep track of the instructions about where and how to use these. Pharmacies typically print the medication instructions only on the boxes and not directly on the medication tubes.   If your medication is too expensive, please contact our office at [phone] option 4 or send [place] a message through MyChart.   We are unable to tell what your co-pay for medications will be in advance as this  is different depending on your insurance coverage. However, we may be able to find a substitute medication at lower cost or fill out paperwork to get insurance to cover a needed medication.   If a prior authorization is required to get your medication covered by your insurance company, please allow [place] 1-2 business days to complete this process.  Drug prices often vary depending on where the prescription is filled and some pharmacies may offer cheaper prices.  The website www.goodrx.com contains coupons for medications through different pharmacies. The prices here do not account for what the cost may be with help from insurance (it may be cheaper with your insurance), but the website can give you the price if you did not use any insurance.  - You can print the associated coupon and take it with your prescription to the pharmacy.  - You may also stop by our office during regular business hours and pick up a GoodRx coupon card.  - If you need your prescription sent electronically to a different pharmacy, notify our office through [facility] or by phone at  [phone] option 4.     Si Usted Necesita Algo Despus de Su Visita  Tambin puede enviarnos un mensaje a travs de Clinical cytogeneticist. Por lo general respondemos a los mensajes de MyChart en el transcurso de 1 a 2 das hbiles.  Para renovar recetas, por favor pida a su farmacia que se ponga en contacto con nuestra oficina. Annie Sable de fax es [place] [phone].  Si tiene un asunto urgente cuando la clnica est cerrada y que no puede esperar hasta el siguiente da hbil, puede llamar/localizar a su doctor(a) al nmero que aparece a continuacin.   Por favor, tenga en cuenta que aunque hacemos todo lo posible para estar disponibles para asuntos urgentes fuera del horario de [place], no estamos disponibles las 24 horas del da, los 7 [address]  [address] de la [place].   Si tiene un problema urgente y no puede comunicarse con nosotros, puede optar por buscar atencin mdica  en el consultorio de su doctor(a), en una clnica privada, en un centro de atencin urgente o en una sala de emergencias.  Si tiene Engineer, drilling, por favor llame inmediatamente al 911 o vaya a la sala de emergencias.  Nmeros de bper  - Dr. Gwen Pounds: [phone]  - Dra. Roseanne Reno: [phone]  - Dr. Katrinka Blazing: [phone]   En caso de inclemencias del tiempo, por favor llame a Lacy Duverney principal al [phone] para una actualizacin sobre el [place] de cualquier retraso o cierre.  Consejos para la medicacin en dermatologa: Por favor, guarde las cajas en las que vienen los medicamentos de uso tpico para ayudarle a seguir las instrucciones sobre dnde y cmo usarlos. Las farmacias generalmente imprimen las instrucciones del medicamento slo en las cajas y no directamente en los tubos del [place].   Si su medicamento es muy caro, por favor, pngase en contacto con Rolm Gala llamando al [phone] y presione la opcin 4 o envenos un mensaje a travs de Clinical cytogeneticist.   No podemos decirle cul ser su copago por los  medicamentos por adelantado ya que esto es diferente dependiendo de la cobertura de su seguro. Sin embargo, es posible que podamos encontrar un medicamento sustituto a Audiological scientist un formulario para que el seguro cubra el medicamento que se considera necesario.   Si se requiere una autorizacin previa para que su compaa de seguros [place] su medicamento, por favor permtanos de 1 a 2 das hbiles  para Production designer, theatre/television/film.  Los precios de los medicamentos varan con frecuencia dependiendo del Environmental consultant de dnde se surte la receta y alguna farmacias pueden ofrecer precios ms baratos.  El sitio web www.goodrx.com tiene cupones para medicamentos de Health and safety inspector. Los precios aqu no tienen en cuenta lo que podra costar con la ayuda del seguro (puede ser ms barato con su seguro), pero el sitio web puede darle el precio si no utiliz Tourist information centre manager.  - Puede imprimir el cupn correspondiente y llevarlo con su receta a la farmacia.  - Tambin puede pasar por nuestra oficina durante el horario de atencin regular y Education officer, museum una tarjeta de cupones de GoodRx.  - Si necesita que su receta se enve electrnicamente a una farmacia diferente, informe a nuestra oficina a travs de MyChart de [place] o por telfono llamando al [phone] y presione la opcin 4.

## 2023-03-29 NOTE — Progress Notes (Signed)
Follow-Up Visit   Subjective  Heidi Watson is a 57 y.o. female who presents for the following: Skin Cancer Screening and Full Body Skin Exam. Hx MM, HxMIS, HxDN    The patient presents for Total-Body Skin Exam (TBSE) for skin cancer screening and mole check. The patient has spots, moles and lesions to be evaluated, some may be new or changing and the patient has concerns that these could be cancer.    The following portions of the chart were reviewed this encounter and updated as appropriate: medications, allergies, medical history  Review of Systems:  No other skin or systemic complaints except as noted in HPI or Assessment and Plan.  Objective  Well appearing patient in no apparent distress; mood and affect are within normal limits.  A full examination was performed including scalp, head, eyes, ears, nose, lips, neck, chest, axillae, abdomen, back, buttocks, bilateral upper extremities, bilateral lower extremities, hands, feet, fingers, toes, fingernails, and toenails. All findings within normal limits unless otherwise noted below.   Relevant physical exam findings are noted in the Assessment and Plan.     Assessment & Plan   HISTORY OF MELANOMA. Right post prox lat thigh - Breslow's 0.80mm, Clark Level II. 10/11/2016 - No evidence of recurrence today - No lymphadenopathy - Recommend regular full body skin exams - Recommend daily broad spectrum sunscreen SPF 30+ to sun-exposed areas, reapply every 2 hours as needed.  - Call if any new or changing lesions are noted between office visits   HISTORY OF MELANOMA IN SITU. Left distal tricep. 09/26/2017.  - No evidence of recurrence today - No lymphadenopathy - Recommend regular full body skin exams - Recommend daily broad spectrum sunscreen SPF 30+ to sun-exposed areas, reapply every 2 hours as needed.  - Call if any new or changing lesions are noted between office visits   Patient prefers skin checks every 6 months.  HISTORY  OF DYSPLASTIC NEVI. Multiple sites, see history.  No evidence of recurrence today Recommend regular full body skin exams Recommend daily broad spectrum sunscreen SPF 30+ to sun-exposed areas, reapply every 2 hours as needed.  Call if any new or changing lesions are noted between office visits   LENTIGINES, SEBORRHEIC KERATOSES, HEMANGIOMAS - Benign normal skin lesions - Benign-appearing - Call for any changes  MELANOCYTIC NEVI - Tan-brown and/or pink-flesh-colored symmetric macules and papules - Benign appearing on exam today - Observation - Call clinic for new or changing moles - Recommend daily use of broad spectrum spf 30+ sunscreen to sun-exposed areas.   ACTINIC DAMAGE - Chronic condition, secondary to cumulative UV/sun exposure - diffuse scaly erythematous macules with underlying dyspigmentation - Recommend daily broad spectrum sunscreen SPF 30+ to sun-exposed areas, reapply every 2 hours as needed.  - Staying in the shade or wearing long sleeves, sun glasses (UVA+UVB protection) and wide brim hats (4-inch brim around the entire circumference of the hat) are also recommended for sun protection.  - Call for new or changing lesions.  SKIN CANCER SCREENING PERFORMED TODAY.  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 Condition is bothersome/symptomatic for patient. Currently flared.   Rosacea is a chronic progressive skin condition usually affecting the face of adults, causing redness and/or acne bumps. It is treatable but not curable. It sometimes affects the eyes (ocular rosacea) as well. It may respond to topical and/or systemic medication and can flare with stress, sun exposure,  alcohol, exercise, topical steroids (including hydrocortisone/cortisone 10) and some foods.  Daily application of broad spectrum spf 30+ sunscreen to face is recommended to reduce flares.  Patient  denies grittiness of the eyes  Treatment Plan Patient deferred treatment at this time.  Counseling for BBL / IPL / Laser and Coordination of Care Discussed the treatment option of Broad Band Light (BBL) /Intense Pulsed Light (IPL)/ Laser for skin discoloration, including brown spots and redness.  Typically we recommend at least 1-3 treatment sessions about 5-8 weeks apart for best results.  Cannot have tanned skin when BBL performed, and regular use of sunscreen/photoprotection is advised after the procedure to help maintain results. The patient's condition may also require "maintenance treatments" in the future.  The fee for BBL / laser treatments is $350 per treatment session for the whole face.  A fee can be quoted for other parts of the body.  Insurance typically does not pay for BBL/laser treatments and therefore the fee is an out-of-pocket cost. Recommend prophylactic valtrex treatment. Once scheduled for procedure, will send Rx in prior to patient's appointment.      Xerosis - diffuse xerotic patches - recommend gentle, hydrating skin care - gentle skin care handout given     Return in about 6 months (around 09/27/2023) for TBSE, HxMM, HxMIS, HxDN.  I, Lawson Radar, CMA, am acting as scribe for Armida Sans, MD.   Documentation: I have reviewed the above documentation for accuracy and completeness, and I agree with the above.  Armida Sans, MD

## 2023-04-09 ENCOUNTER — Encounter: Payer: Self-pay | Admitting: Dermatology

## 2023-09-27 ENCOUNTER — Ambulatory Visit: Payer: BC Managed Care – PPO | Admitting: Dermatology

## 2023-10-02 ENCOUNTER — Telehealth: Payer: Self-pay

## 2023-10-02 NOTE — Telephone Encounter (Signed)
 Patient states that she has a lesion that is cracked, scabbed, and bleeding inside her nose x 2 mths. She states that she is unable to see it, but her husband said it was on the inside of her nose near the crease. I advised patient to contact ENT since they have specific tools to have a clearer view of location, and if she needed anything from our office to call us  back.

## 2023-10-25 ENCOUNTER — Ambulatory Visit: Admitting: Dermatology

## 2023-10-25 ENCOUNTER — Encounter: Payer: Self-pay | Admitting: Dermatology

## 2023-10-25 DIAGNOSIS — R21 Rash and other nonspecific skin eruption: Secondary | ICD-10-CM

## 2023-10-25 DIAGNOSIS — Z85820 Personal history of malignant melanoma of skin: Secondary | ICD-10-CM

## 2023-10-25 DIAGNOSIS — Z7189 Other specified counseling: Secondary | ICD-10-CM

## 2023-10-25 DIAGNOSIS — Z86006 Personal history of melanoma in-situ: Secondary | ICD-10-CM

## 2023-10-25 DIAGNOSIS — Z86018 Personal history of other benign neoplasm: Secondary | ICD-10-CM

## 2023-10-25 DIAGNOSIS — L578 Other skin changes due to chronic exposure to nonionizing radiation: Secondary | ICD-10-CM | POA: Diagnosis not present

## 2023-10-25 DIAGNOSIS — L820 Inflamed seborrheic keratosis: Secondary | ICD-10-CM

## 2023-10-25 DIAGNOSIS — L821 Other seborrheic keratosis: Secondary | ICD-10-CM | POA: Diagnosis not present

## 2023-10-25 DIAGNOSIS — W908XXA Exposure to other nonionizing radiation, initial encounter: Secondary | ICD-10-CM

## 2023-10-25 DIAGNOSIS — Z79899 Other long term (current) drug therapy: Secondary | ICD-10-CM

## 2023-10-25 DIAGNOSIS — Z1283 Encounter for screening for malignant neoplasm of skin: Secondary | ICD-10-CM | POA: Diagnosis not present

## 2023-10-25 DIAGNOSIS — L814 Other melanin hyperpigmentation: Secondary | ICD-10-CM

## 2023-10-25 DIAGNOSIS — D229 Melanocytic nevi, unspecified: Secondary | ICD-10-CM

## 2023-10-25 DIAGNOSIS — D1801 Hemangioma of skin and subcutaneous tissue: Secondary | ICD-10-CM

## 2023-10-25 MED ORDER — VALACYCLOVIR HCL 1 G PO TABS: ORAL_TABLET | ORAL | 0 refills | Status: AC

## 2023-10-25 MED ORDER — TRIAMCINOLONE ACETONIDE 0.1 % EX CREA: 1.0000 | TOPICAL_CREAM | Freq: Two times a day (BID) | CUTANEOUS | 0 refills | Status: AC | PRN

## 2023-10-25 NOTE — Patient Instructions (Signed)

## 2023-10-25 NOTE — Progress Notes (Signed)
 Follow-Up Visit   Subjective  Heidi Watson is a 58 y.o. female who presents for the following: Skin Cancer Screening and Full Body Skin Exam  Rash on  R breast occurred about 5 days ago, woke up itchy and red, not painful.   The patient presents for Total-Body Skin Exam (TBSE) for skin cancer screening and mole check. The patient has spots, moles and lesions to be evaluated, some may be new or changing and the patient may have concern these could be cancer.  The following portions of the chart were reviewed this encounter and updated as appropriate: medications, allergies, medical history  Review of Systems:  No other skin or systemic complaints except as noted in HPI or Assessment and Plan.  Objective  Well appearing patient in no apparent distress; mood and affect are within normal limits.  A full examination was performed including scalp, head, eyes, ears, nose, lips, neck, chest, axillae, abdomen, back, buttocks, bilateral upper extremities, bilateral lower extremities, hands, feet, fingers, toes, fingernails, and toenails. All findings within normal limits unless otherwise noted below.   Relevant physical exam findings are noted in the Assessment and Plan.  L arm x 1 Erythematous stuck-on, waxy papule or plaque  Assessment & Plan   SKIN CANCER SCREENING PERFORMED TODAY.  ACTINIC DAMAGE - Chronic condition, secondary to cumulative UV/sun exposure - diffuse scaly erythematous macules with underlying dyspigmentation - Recommend daily broad spectrum sunscreen SPF 30+ to sun-exposed areas, reapply every 2 hours as needed.  - Staying in the shade or wearing long sleeves, sun glasses (UVA+UVB protection) and wide brim hats (4-inch brim around the entire circumference of the hat) are also recommended for sun protection.  - Call for new or changing lesions.  LENTIGINES, SEBORRHEIC KERATOSES, HEMANGIOMAS - Benign normal skin lesions - Benign-appearing - Call for any  changes  MELANOCYTIC NEVI - Tan-brown and/or pink-flesh-colored symmetric macules and papules - Benign appearing on exam today - Observation - Call clinic for new or changing moles - Recommend daily use of broad spectrum spf 30+ sunscreen to sun-exposed areas.   HISTORY OF MELANOMA - Right post prox lat thigh - Breslow's 0.29mm, Clark Level II. 10/11/2016  - No evidence of recurrence today - No lymphadenopathy - Recommend regular full body skin exams - Recommend daily broad spectrum sunscreen SPF 30+ to sun-exposed areas, reapply every 2 hours as needed.  - Call if any new or changing lesions are noted between office visits  HISTORY OF MELANOMA IN SITU - Left distal tricep. 09/26/2017.  - No evidence of recurrence today - Recommend regular full body skin exams - Recommend daily broad spectrum sunscreen SPF 30+ to sun-exposed areas, reapply every 2 hours as needed.  - Call if any new or changing lesions are noted between office visits  HISTORY OF DYSPLASTIC NEVI - multiple, see problem list No evidence of recurrence today Recommend regular full body skin exams Recommend daily broad spectrum sunscreen SPF 30+ to sun-exposed areas, reapply every 2 hours as needed.  Call if any new or changing lesions are noted between office visits  RASH Bite reaction vs Contact dermatitis vs mild case of shingles -  R lat breast     - Start TMC 0.1% cream to aa BID PRN. Topical steroids (such as triamcinolone , fluocinolone, fluocinonide, mometasone , clobetasol, halobetasol, betamethasone, hydrocortisone) can cause thinning and lightening of the skin if they are used for too long in the same area. Your physician has selected the right strength medicine for your problem  and area affected on the body. Please use your medication only as directed by your physician to prevent side effects.   - Start Valacyclovir  1g TID x 7 days #21 0RF.   INFLAMED SEBORRHEIC KERATOSIS L arm x 1 Symptomatic, irritating,  patient would like treated. Destruction of lesion - L arm x 1 Complexity: simple   Destruction method: cryotherapy   Informed consent: discussed and consent obtained   Timeout:  patient name, date of birth, surgical site, and procedure verified Lesion destroyed using liquid nitrogen: Yes   Region frozen until ice ball extended beyond lesion: Yes   Outcome: patient tolerated procedure well with no complications   Post-procedure details: wound care instructions given    SKIN CANCER SCREENING   HISTORY OF MALIGNANT MELANOMA   HISTORY OF DYSPLASTIC NEVUS   ACTINIC SKIN DAMAGE   SEBORRHEIC KERATOSIS   LENTIGO   MELANOCYTIC NEVUS, UNSPECIFIED LOCATION    Return in about 6 months (around 04/26/2024) for TBSE - Hx MM, MMIS, dysplastic nevi.  LILLETTE Rosina Mayans, CMA, am acting as scribe for Alm Rhyme, MD .   Documentation: I have reviewed the above documentation for accuracy and completeness, and I agree with the above.  Alm Rhyme, MD

## 2023-11-23 ENCOUNTER — Ambulatory Visit: Admitting: Dermatology

## 2023-11-23 ENCOUNTER — Telehealth: Payer: Self-pay

## 2023-11-23 ENCOUNTER — Encounter: Payer: Self-pay | Admitting: Dermatology

## 2023-11-23 DIAGNOSIS — Z7189 Other specified counseling: Secondary | ICD-10-CM | POA: Diagnosis not present

## 2023-11-23 DIAGNOSIS — L0100 Impetigo, unspecified: Secondary | ICD-10-CM | POA: Diagnosis not present

## 2023-11-23 DIAGNOSIS — L258 Unspecified contact dermatitis due to other agents: Secondary | ICD-10-CM

## 2023-11-23 DIAGNOSIS — L259 Unspecified contact dermatitis, unspecified cause: Secondary | ICD-10-CM

## 2023-11-23 DIAGNOSIS — Z79899 Other long term (current) drug therapy: Secondary | ICD-10-CM

## 2023-11-23 DIAGNOSIS — L820 Inflamed seborrheic keratosis: Secondary | ICD-10-CM

## 2023-11-23 DIAGNOSIS — L231 Allergic contact dermatitis due to adhesives: Secondary | ICD-10-CM

## 2023-11-23 MED ORDER — CLOBETASOL PROPIONATE 0.05 % EX CREA
TOPICAL_CREAM | CUTANEOUS | 0 refills | Status: AC
Start: 2023-11-23 — End: ?

## 2023-11-23 MED ORDER — MUPIROCIN 2 % EX OINT
TOPICAL_OINTMENT | CUTANEOUS | 0 refills | Status: AC
Start: 2023-11-23 — End: ?

## 2023-11-23 NOTE — Progress Notes (Signed)
   Follow-Up Visit   Subjective  Heidi Watson is a 58 y.o. female who presents for the following: recheck inflamed SK site at the left forearm, treated with cryotherapy 10/25/23. She used Vaseline for 2 weeks with bandage. She was having a reaction to bandage, so she has been keeping area open and using Vaseline only. She is using hydrocortisone cream to surrounding area that is itchy. She noticed a few blisters coming up yesterday.   The following portions of the chart were reviewed this encounter and updated as appropriate: medications, allergies, medical history  Review of Systems:  No other skin or systemic complaints except as noted in HPI or Assessment and Plan.  Objective  Well appearing patient in no apparent distress; mood and affect are within normal limits.  A focused examination was performed of the following areas: Left arm  Relevant physical exam findings are noted in the Assessment and Plan.       Assessment & Plan  INFLAMED SEBORRHEIC KERATISIS LN2 SITE WITH CONTACT DERMATITIS SECONDARY TO BANDAGE AND IMPETIGINIZATION Exam: Erythema with mild weeping, some induration and impetiginization.  Treatment: Start mupirocin 2% ointment twice a day to aa left arm x 2 weeks or until improved dsp 22g 0Rf. Start Clobetasol cream twice a day to aa left arm x 2 weeks or until improved dsp 15g 0Rf. Avoid applying to face, groin, and axilla. Use as directed. Long-term use can cause thinning of the skin.  Patient advised to return to clinic sooner than 2 weeks if not improving and surrounding redness is expanding. Patient may cancel 2 week follow-up if area is improved.   Topical steroids (such as triamcinolone, fluocinolone, fluocinonide, mometasone, clobetasol, halobetasol, betamethasone, hydrocortisone) can cause thinning and lightening of the skin if they are used for too long in the same area. Your physician has selected the right strength medicine for your problem and area  affected on the body. Please use your medication only as directed by your physician to prevent side effects.   FOLLICULITIS   IMPETIGO   Related Medications mupirocin ointment (BACTROBAN) 2 % Apply twice a day x 2 weeks to affected area left arm. ALLERGIC CONTACT DERMATITIS DUE TO ADHESIVES   Related Medications clobetasol cream (TEMOVATE) 0.05 % Apply twice a day to affected area left arm x 2 weeks. Avoid applying to face, groin, and axilla. Use as directed. Long-term use can cause thinning of the skin.   Return in about 2 weeks (around 12/07/2023) for recheck left arm. Pt will cancel if improved.  LILLETTE Andrea Kerns, CMA, am acting as scribe for Alm Rhyme, MD .   Documentation: I have reviewed the above documentation for accuracy and completeness, and I agree with the above.  Alm Rhyme, MD

## 2023-11-23 NOTE — Patient Instructions (Signed)

## 2023-11-23 NOTE — Telephone Encounter (Signed)
 Spoke with patient this week regarding SK site that was frozen at July appt. Patient states the are areas healing with no opened wounds but terribly itchy. Advised patient she could use OTC Hydrocortisone Cream around the site as long as there were no open tissue to causing any type of infection. Patient states the area is now very swollen and draining. Patient coming in for appt today. aw

## 2023-12-07 ENCOUNTER — Encounter: Payer: Self-pay | Admitting: Dermatology

## 2023-12-07 ENCOUNTER — Ambulatory Visit: Admitting: Dermatology

## 2023-12-07 DIAGNOSIS — L820 Inflamed seborrheic keratosis: Secondary | ICD-10-CM

## 2023-12-07 DIAGNOSIS — Z79899 Other long term (current) drug therapy: Secondary | ICD-10-CM

## 2023-12-07 DIAGNOSIS — L231 Allergic contact dermatitis due to adhesives: Secondary | ICD-10-CM

## 2023-12-07 DIAGNOSIS — Z7189 Other specified counseling: Secondary | ICD-10-CM

## 2023-12-07 DIAGNOSIS — L2389 Allergic contact dermatitis due to other agents: Secondary | ICD-10-CM

## 2023-12-07 NOTE — Patient Instructions (Signed)

## 2023-12-07 NOTE — Progress Notes (Signed)
   Follow-Up Visit   Subjective  Heidi Watson is a 58 y.o. female who presents for the following: Recheck LN2 site, pt currently using Mupirocin  2% ointment and Clobetasol  0.05% twice daily. She has noticed improvement in redness and swelling around site, but continues to have bumps that come and go. Pt states lesion can be very itchy at time.  The following portions of the chart were reviewed this encounter and updated as appropriate: medications, allergies, medical history  Review of Systems:  No other skin or systemic complaints except as noted in HPI or Assessment and Plan.  Objective  Well appearing patient in no apparent distress; mood and affect are within normal limits.  A focused examination was performed of the following areas: the face and L arm  Relevant exam findings are noted in the Assessment and Plan.    Assessment & Plan   INFLAMED SEBORRHEIC KERATISIS LN2 SITE WITH CONTACT DERMATITIS SECONDARY TO BANDAGE AND IMPETIGINIZATION, much improved  Only some remaining contact evidence.  No longer impetiginization. Exam: decreased erythema and scale.  No infection.  Some remaining tiny vesicles.  Treatment: Continue clobetasol  0.05% cream to aa's BID for two more weeks. Avoid applying to face, groin, and axilla. Use as directed. Long-term use can cause thinning of the skin.    Do not use bandage that she used prior to reaction occurring. Discussed with patient that she could tst by putting bandage in a thinned skin area and see if reaction occurs to know for sure that she reacts to that particular bandage.   Return for appointment as scheduled.  LILLETTE Rosina Mayans, CMA, am acting as scribe for Alm Rhyme, MD .   Documentation: I have reviewed the above documentation for accuracy and completeness, and I agree with the above.  Alm Rhyme, MD

## 2024-01-29 ENCOUNTER — Telehealth: Payer: Self-pay

## 2024-01-29 NOTE — Telephone Encounter (Signed)
 Patient called to let Dr MARLA know the Clobetasol  cream she was prescribed for her allergic reaction is not helping, she would like a different rx

## 2024-01-29 NOTE — Telephone Encounter (Signed)
 Scheduled patient a return appt to recheck a persistent rash

## 2024-02-05 ENCOUNTER — Ambulatory Visit: Admitting: Dermatology

## 2024-02-05 DIAGNOSIS — Z7189 Other specified counseling: Secondary | ICD-10-CM

## 2024-02-05 DIAGNOSIS — L409 Psoriasis, unspecified: Secondary | ICD-10-CM

## 2024-02-05 DIAGNOSIS — L231 Allergic contact dermatitis due to adhesives: Secondary | ICD-10-CM | POA: Diagnosis not present

## 2024-02-05 DIAGNOSIS — Z79899 Other long term (current) drug therapy: Secondary | ICD-10-CM

## 2024-02-05 NOTE — Progress Notes (Signed)
   Follow-Up Visit   Subjective  Heidi Watson is a 58 y.o. female who presents for the following: recheck allergic contact reaction from a Band-Aid 2 months ago, rash that will not go away, treating with Clobetasol  cream with a poor response    The following portions of the chart were reviewed this encounter and updated as appropriate: medications, allergies, medical history  Review of Systems:  No other skin or systemic complaints except as noted in HPI or Assessment and Plan.  Objective  Well appearing patient in no apparent distress; mood and affect are within normal limits.  A focused examination was performed of the following areas: Left arm   Relevant exam findings are noted in the Assessment and Plan.         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    Patch testing was performed on upper back using standard technique. True test x 36 applied to back at visit today. Pt advised to keep panels dry until removal in 2 days for first read.    D/C Enstilar  foam and Clobetasol  cream   Start Zoryve  cream apply to skin daily   ALLERGIC CONTACT DERMATITIS DUE TO ADHESIVES   Related Procedures Patch Test Related Medications clobetasol  cream (TEMOVATE ) 0.05 % Apply twice a day to affected area left arm x 2 weeks. Avoid applying to face, groin, and axilla. Use as directed. Long-term use can cause thinning of the skin.  Return in about 3 days (around 02/08/2024) for nurse- patch test reading and 02/12/24 with Dr MARLA .  I, Fay Kirks, CMA, am acting as scribe for Alm Rhyme, MD .   Documentation: I have reviewed the above documentation for accuracy and completeness, and I agree with the above.  Alm Rhyme, MD

## 2024-02-05 NOTE — Patient Instructions (Signed)

## 2024-02-07 ENCOUNTER — Ambulatory Visit (INDEPENDENT_AMBULATORY_CARE_PROVIDER_SITE_OTHER)

## 2024-02-07 DIAGNOSIS — L239 Allergic contact dermatitis, unspecified cause: Secondary | ICD-10-CM

## 2024-02-07 NOTE — Progress Notes (Signed)
 Patient here today for day 3 patch test reading. Patient showed no signs of any reactions. Photo taken.  Patient advised do not soak nor scrub testing site.   Alan Pizza, RMA

## 2024-02-12 ENCOUNTER — Ambulatory Visit: Admitting: Dermatology

## 2024-02-12 DIAGNOSIS — L231 Allergic contact dermatitis due to adhesives: Secondary | ICD-10-CM

## 2024-02-12 DIAGNOSIS — Z7189 Other specified counseling: Secondary | ICD-10-CM

## 2024-02-12 NOTE — Patient Instructions (Signed)

## 2024-02-12 NOTE — Progress Notes (Unsigned)
   Follow-Up Visit   Subjective  Heidi Watson is a 58 y.o. female who presents for the following: Paitient here for final patch test reading, patient report positive 14 Epoxy Resin and 28 Gold     T.R.U.E. Test - 02/12/24 1300       Test Information   Number of Test 36    Reading Interval Day 5    Panel Panel 1;Panel 2;Panel 3      Panel 2   14. Epoxy Resin 2      Panel 3   25. Diazolidinyl Urea 1    28. Gold Sodium Thiosulfate 1          The following portions of the chart were reviewed this encounter and updated as appropriate: medications, allergies, medical history  Review of Systems:  No other skin or systemic complaints except as noted in HPI or Assessment and Plan.  Objective  Well appearing patient in no apparent distress; mood and affect are within normal limits.  A focused examination was performed of the following areas: Face,chest, back   Relevant exam findings are noted in the Assessment and Plan.    Assessment & Plan   ALLERGIC CONTACT DERMATITIS  Back  We will recheck in 1 week due to light positive reactions noticed today in the office  Patient advised do not soak nor scrub testing site.   Recheck in 1 week     Return in about 1 week (around 02/19/2024) for contact dermatitis .  IFay Kirks, CMA, am acting as scribe for Alm Rhyme, MD .   Documentation: I have reviewed the above documentation for accuracy and completeness, and I agree with the above.  Alm Rhyme, MD

## 2024-02-13 ENCOUNTER — Encounter: Payer: Self-pay | Admitting: Dermatology

## 2024-02-14 ENCOUNTER — Encounter: Payer: Self-pay | Admitting: Dermatology

## 2024-02-21 ENCOUNTER — Ambulatory Visit: Admitting: Dermatology

## 2024-02-21 ENCOUNTER — Encounter: Payer: Self-pay | Admitting: Dermatology

## 2024-02-21 DIAGNOSIS — R21 Rash and other nonspecific skin eruption: Secondary | ICD-10-CM | POA: Diagnosis not present

## 2024-02-21 DIAGNOSIS — L231 Allergic contact dermatitis due to adhesives: Secondary | ICD-10-CM

## 2024-02-21 DIAGNOSIS — Z7189 Other specified counseling: Secondary | ICD-10-CM

## 2024-02-21 DIAGNOSIS — L300 Nummular dermatitis: Secondary | ICD-10-CM

## 2024-02-21 NOTE — Patient Instructions (Addendum)

## 2024-02-21 NOTE — Progress Notes (Signed)
   Follow-Up Visit   Subjective  Heidi Watson is a 58 y.o. female who presents for the following: Recheck patch testing sites for any residual reactions, previously noted light positive reactions. Patient report positive 14 Epoxy Resin and 28 Gold.  The following portions of the chart were reviewed this encounter and updated as appropriate: medications, allergies, medical history  Review of Systems:  No other skin or systemic complaints except as noted in HPI or Assessment and Plan.  Objective  Well appearing patient in no apparent distress; mood and affect are within normal limits.  A focused examination was performed of the following areas: the back  Relevant exam findings are noted in the Assessment and Plan.  L forearm 5.5 cm patch with erythema, some induration, pustules, and impetiginization.    Assessment & Plan   RASH AND OTHER NONSPECIFIC SKIN ERUPTION L forearm Questionable reaction to patch test #20, no reaction seen today on #25 and #28, positive reaction to #14.   Rash is chronic and persistent even with topical steroid tx - discussed and recommend bx today  Stop all other topical medications and only use Mupirocin  2% ointment BID until bx site has healed. Skin / nail biopsy - L forearm Type of biopsy: tangential   Informed consent: discussed and consent obtained   Timeout: patient name, date of birth, surgical site, and procedure verified   Procedure prep:  Patient was prepped and draped in usual sterile fashion Prep type:  Isopropyl alcohol Anesthesia: the lesion was anesthetized in a standard fashion   Anesthetic:  1% lidocaine w/ epinephrine 1-100,000 buffered w/ 8.4% NaHCO3 Instrument used: flexible razor blade   Hemostasis achieved with: pressure, aluminum chloride and electrodesiccation   Outcome: patient tolerated procedure well   Post-procedure details: sterile dressing applied and wound care instructions given   Dressing type: bandage (Mupirocin  2%  ointment)    Specimen 1 - Surgical pathology Differential Diagnosis: R21 r/o allergic contact dermatitis vs tinea vs psoriasis Check Margins: No  Return for appointment as scheduled.  Heidi Watson, CMA, am acting as scribe for Alm Rhyme, MD .   Documentation: I have reviewed the above documentation for accuracy and completeness, and I agree with the above.  Alm Rhyme, MD

## 2024-02-22 LAB — SURGICAL PATHOLOGY

## 2024-02-26 ENCOUNTER — Ambulatory Visit: Payer: Self-pay | Admitting: Dermatology

## 2024-02-27 NOTE — Telephone Encounter (Addendum)
 Called and discussed bx results with patient. She verbalized understanding. Will continue to treatment if needed.    ----- Message from Alm Rhyme sent at 02/26/2024  5:15 PM EST ----- FINAL DIAGNOSIS        1. Skin, left forearm :       CHRONIC CONTACT OR NUMMULAR DERMATITIS   Consistent with Contact Dermatitis May continue topical treatment if helpful ----- Message ----- From: Interface, [facility] Sent: 02/22/2024   3:50 PM EST To: Alm JAYSON Rhyme, MD

## 2024-04-16 ENCOUNTER — Ambulatory Visit: Admitting: Dermatology

## 2024-04-16 ENCOUNTER — Encounter: Payer: Self-pay | Admitting: Dermatology

## 2024-04-16 DIAGNOSIS — W908XXA Exposure to other nonionizing radiation, initial encounter: Secondary | ICD-10-CM | POA: Diagnosis not present

## 2024-04-16 DIAGNOSIS — D692 Other nonthrombocytopenic purpura: Secondary | ICD-10-CM

## 2024-04-16 DIAGNOSIS — D229 Melanocytic nevi, unspecified: Secondary | ICD-10-CM

## 2024-04-16 DIAGNOSIS — L814 Other melanin hyperpigmentation: Secondary | ICD-10-CM

## 2024-04-16 DIAGNOSIS — Z86018 Personal history of other benign neoplasm: Secondary | ICD-10-CM | POA: Diagnosis not present

## 2024-04-16 DIAGNOSIS — L720 Epidermal cyst: Secondary | ICD-10-CM

## 2024-04-16 DIAGNOSIS — L729 Follicular cyst of the skin and subcutaneous tissue, unspecified: Secondary | ICD-10-CM

## 2024-04-16 DIAGNOSIS — D1801 Hemangioma of skin and subcutaneous tissue: Secondary | ICD-10-CM

## 2024-04-16 DIAGNOSIS — Z1283 Encounter for screening for malignant neoplasm of skin: Secondary | ICD-10-CM | POA: Diagnosis not present

## 2024-04-16 DIAGNOSIS — Z85820 Personal history of malignant melanoma of skin: Secondary | ICD-10-CM

## 2024-04-16 DIAGNOSIS — L578 Other skin changes due to chronic exposure to nonionizing radiation: Secondary | ICD-10-CM | POA: Diagnosis not present

## 2024-04-16 DIAGNOSIS — L409 Psoriasis, unspecified: Secondary | ICD-10-CM | POA: Diagnosis not present

## 2024-04-16 DIAGNOSIS — L820 Inflamed seborrheic keratosis: Secondary | ICD-10-CM | POA: Diagnosis not present

## 2024-04-16 DIAGNOSIS — Z79899 Other long term (current) drug therapy: Secondary | ICD-10-CM

## 2024-04-16 DIAGNOSIS — L821 Other seborrheic keratosis: Secondary | ICD-10-CM | POA: Diagnosis not present

## 2024-04-16 DIAGNOSIS — R21 Rash and other nonspecific skin eruption: Secondary | ICD-10-CM | POA: Diagnosis not present

## 2024-04-16 DIAGNOSIS — Z7189 Other specified counseling: Secondary | ICD-10-CM

## 2024-04-16 MED ORDER — ENSTILAR 0.005-0.064 % EX FOAM: CUTANEOUS | 6 refills | Status: AC

## 2024-04-16 MED ORDER — ZORYVE 0.3 % EX CREA: TOPICAL_CREAM | CUTANEOUS | 5 refills | Status: AC

## 2024-04-16 NOTE — Progress Notes (Signed)
 "  Follow-Up Visit   Subjective  Heidi Watson is a 59 y.o. female who presents for the following: Skin Cancer Screening and Full Body Skin Exam, hx of Melanoma, hx of Dysplastic nevus  The patient presents for Total-Body Skin Exam (TBSE) for skin cancer screening and mole check. The patient has spots, moles and lesions to be evaluated, some may be new or changing and the patient may have concern these could be cancer.  The following portions of the chart were reviewed this encounter and updated as appropriate: medications, allergies, medical history  Review of Systems:  No other skin or systemic complaints except as noted in HPI or Assessment and Plan.  Objective  Well appearing patient in no apparent distress; mood and affect are within normal limits.  A full examination was performed including scalp, head, eyes, ears, nose, lips, neck, chest, axillae, abdomen, back, buttocks, bilateral upper extremities, bilateral lower extremities, hands, feet, fingers, toes, fingernails, and toenails. All findings within normal limits unless otherwise noted below.   Relevant physical exam findings are noted in the Assessment and Plan.  left bicep x 1, right forehead x 1, right nose x 1 (3) Stuck-on, waxy, tan-brown papule or plaque --Discussed benign etiology and prognosis.   Assessment & Plan   SKIN CANCER SCREENING PERFORMED TODAY.  ACTINIC DAMAGE - Chronic condition, secondary to cumulative UV/sun exposure - diffuse scaly erythematous macules with underlying dyspigmentation - Recommend daily broad spectrum sunscreen SPF 30+ to sun-exposed areas, reapply every 2 hours as needed.  - Staying in the shade or wearing long sleeves, sun glasses (UVA+UVB protection) and wide brim hats (4-inch brim around the entire circumference of the hat) are also recommended for sun protection.  - Call for new or changing lesions.  LENTIGINES, SEBORRHEIC KERATOSES, HEMANGIOMAS - Benign normal skin lesions -  Benign-appearing - Call for any changes  MELANOCYTIC NEVI - Tan-brown and/or pink-flesh-colored symmetric macules and papules - Benign appearing on exam today - Observation - Call clinic for new or changing moles - Recommend daily use of broad spectrum spf 30+ sunscreen to sun-exposed areas.   RASH/ALLERGIC CONTACT DERMATITIS causing Psoriasis flare (Koebnerization) Left forearm  Clear skin today  Purpura - Chronic; persistent and recurrent.  Treatable, but not curable. - Violaceous macules and patches - Benign - Related to trauma, age, sun damage and/or use of blood thinners, chronic use of topical and/or oral steroids - Observe - Can use OTC arnica containing moisturizer such as Dermend Bruise Formula if desired - Call for worsening or other concerns   Milia - tiny firm white papules - type of cyst - benign - sometimes these will clear with nightly OTC adapalene/Differin 0.1% gel or retinol. - may be extracted if symptomatic - observe    HISTORY OF DYSPLASTIC NEVUS No evidence of recurrence today Recommend regular full body skin exams Recommend daily broad spectrum sunscreen SPF 30+ to sun-exposed areas, reapply every 2 hours as needed.  Call if any new or changing lesions are noted between office visits   HISTORY OF MELANOMA IN SITU Left distal tricep  - No evidence of recurrence today - Recommend regular full body skin exams - Recommend daily broad spectrum sunscreen SPF 30+ to sun-exposed areas, reapply every 2 hours as needed.  - Call if any new or changing lesions are noted between office visits    HISTORY OF MELANOMA Right post prox mid thigh  - No evidence of recurrence today - No lymphadenopathy - Recommend regular full body skin  exams - Recommend daily broad spectrum sunscreen SPF 30+ to sun-exposed areas, reapply every 2 hours as needed.  - Call if any new or changing lesions are noted between office visits   INFLAMED SEBORRHEIC KERATOSIS (3) left  bicep x 1, right forehead x 1, right nose x 1 (3) ISK   Symptomatic, irritating, patient would like treated.  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 Condition is bothersome/symptomatic for patient. Currently flared due to being out of medications.   Psoriasis is a chronic non-curable, but treatable genetic/hereditary disease that may have other systemic features affecting other organ systems such as joints (Psoriatic Arthritis). It is associated with an increased risk of inflammatory bowel disease, heart disease, non-alcoholic fatty liver disease, and depression.     Continue Zoryve  cream to aa's QD and Enstilar  foam QD 5d/wk prn active affected areas.  Existing Treatments - ENSTILAR  0.005-0.064 % FOAM - APPLY TO THE AFFECTED AREAS DAILY. - ZORYVE  0.3 % CREA - APPLY ONE APPLICATION TOPICALLY DAILY. APPLY TO AFFECTED AREAS OF FEET AND RASH AT LOWER LEGS   Return in about 6 months (around 10/14/2024) for TBSE, hx of Melanoma, hx of Dysplastic nevus .  IFay Kirks, CMA, am acting as scribe for Alm Rhyme, MD .   Documentation: I have reviewed the above documentation for accuracy and completeness, and I agree with the above.  Alm Rhyme, MD    "

## 2024-04-16 NOTE — Patient Instructions (Addendum)

## 2024-10-29 ENCOUNTER — Ambulatory Visit: Admitting: Dermatology
